# Patient Record
Sex: Female | Born: 2000 | Race: White | Hispanic: No | Smoking: Former smoker
Health system: Southern US, Community
[De-identification: ages and names within clinical notes are randomized; demographics above are authoritative.]

## PROBLEM LIST (undated history)

## (undated) DIAGNOSIS — E7800 Pure hypercholesterolemia, unspecified: Secondary | ICD-10-CM

## (undated) DIAGNOSIS — L409 Psoriasis, unspecified: Secondary | ICD-10-CM

## (undated) DIAGNOSIS — J45909 Unspecified asthma, uncomplicated: Secondary | ICD-10-CM

## (undated) DIAGNOSIS — T7411XA Adult physical abuse, confirmed, initial encounter: Secondary | ICD-10-CM

## (undated) DIAGNOSIS — F319 Bipolar disorder, unspecified: Secondary | ICD-10-CM

## (undated) DIAGNOSIS — F4310 Post-traumatic stress disorder, unspecified: Secondary | ICD-10-CM

## (undated) HISTORY — PX: HIP SURGERY: SHX245

## (undated) HISTORY — DX: Pure hypercholesterolemia, unspecified: E78.00

## (undated) HISTORY — DX: Unspecified asthma, uncomplicated: J45.909

---
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
## 2021-11-21 ENCOUNTER — Emergency Department (HOSPITAL_COMMUNITY): Payer: Medicaid Other

## 2021-11-21 ENCOUNTER — Encounter (HOSPITAL_COMMUNITY): Payer: Self-pay | Admitting: *Deleted

## 2021-11-21 ENCOUNTER — Emergency Department (HOSPITAL_COMMUNITY)
Admission: EM | Admit: 2021-11-21 | Discharge: 2021-11-21 | Disposition: A | Discharge status: Home / Self Care | Payer: Medicaid Other | Attending: Emergency Medicine | Admitting: Emergency Medicine

## 2021-11-21 ENCOUNTER — Other Ambulatory Visit: Payer: Self-pay

## 2021-11-21 DIAGNOSIS — F17210 Nicotine dependence, cigarettes, uncomplicated: Secondary | ICD-10-CM | POA: Insufficient documentation

## 2021-11-21 DIAGNOSIS — S3992XA Unspecified injury of lower back, initial encounter: Secondary | ICD-10-CM | POA: Diagnosis present

## 2021-11-21 DIAGNOSIS — X58XXXA Exposure to other specified factors, initial encounter: Secondary | ICD-10-CM | POA: Insufficient documentation

## 2021-11-21 DIAGNOSIS — S39012A Strain of muscle, fascia and tendon of lower back, initial encounter: Secondary | ICD-10-CM | POA: Diagnosis not present

## 2021-11-21 LAB — URINALYSIS, ROUTINE W REFLEX MICROSCOPIC
Bilirubin Urine: NEGATIVE
Glucose, UA: NEGATIVE mg/dL
Hgb urine dipstick: NEGATIVE
Ketones, ur: NEGATIVE mg/dL
Leukocytes,Ua: NEGATIVE
Nitrite: NEGATIVE
Protein, ur: NEGATIVE mg/dL
Specific Gravity, Urine: >1.03 — ABNORMAL HIGH (ref 1.005–1.030)
pH: 6 (ref 5.0–8.0)

## 2021-11-21 LAB — PREGNANCY, URINE: Preg Test, Ur: NEGATIVE

## 2021-11-21 MED ORDER — MELOXICAM 15 MG PO TABS
15.0000 mg | ORAL_TABLET | Freq: Once | ORAL | Status: AC
  Administered 2021-11-21: 15 mg via ORAL
  Filled 2021-11-21: qty 1

## 2021-11-21 MED ORDER — MELOXICAM 15 MG PO TABS: 15.0000 mg | ORAL_TABLET | Freq: Every day | ORAL | 0 refills | Status: DC

## 2021-11-21 MED ORDER — CYCLOBENZAPRINE HCL 10 MG PO TABS: 5.0000 mg | ORAL_TABLET | Freq: Two times a day (BID) | ORAL | 0 refills | Status: DC | PRN

## 2021-11-21 MED ORDER — CYCLOBENZAPRINE HCL 10 MG PO TABS
5.0000 mg | ORAL_TABLET | Freq: Once | ORAL | Status: AC
  Administered 2021-11-21: 5 mg via ORAL

## 2021-11-21 NOTE — ED Triage Notes (Signed)
Pt c/o lower back pain x 3 days; pt denies any urinary sx

## 2021-11-21 NOTE — Discharge Instructions (Signed)
Your back x-ray was normal, your urine is normal, urine pregnancy test was negative.  I am discharging you with pain medication and a muscle relaxer.  Also home stretches which you may use as needed. SEEK IMMEDIATE MEDICAL ATTENTION IF: New numbness, tingling, weakness, or problem with the use of your arms or legs.  Severe back pain not relieved with medications.  Change in bowel or bladder control.  Increasing pain in any areas of the body (such as chest or abdominal pain).  Shortness of breath, dizziness or fainting.  Nausea (feeling sick to your stomach), vomiting, fever, or sweats.
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
## 2021-12-05 ENCOUNTER — Other Ambulatory Visit: Payer: Self-pay

## 2021-12-05 ENCOUNTER — Ambulatory Visit
Admission: EM | Admit: 2021-12-05 | Discharge: 2021-12-05 | Disposition: A | Discharge status: Home / Self Care | Payer: Medicaid Other | Attending: Family Medicine | Admitting: Family Medicine

## 2021-12-05 DIAGNOSIS — H109 Unspecified conjunctivitis: Secondary | ICD-10-CM | POA: Diagnosis not present

## 2021-12-05 MED ORDER — POLYMYXIN B-TRIMETHOPRIM 10000-0.1 UNIT/ML-% OP SOLN: 1.0000 [drp] | Freq: Four times a day (QID) | OPHTHALMIC | 0 refills | Status: DC
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
## 2021-12-05 NOTE — ED Triage Notes (Signed)
Patient states she started noticing that her right eye was pink this morning but while sitting in waiting room her left eye has started turning red as well. She states it is itching really bad.  She states her brother was diagnosed over the weekend with the pink eye and she was with him all weekend.  She states she put Clear Eyes eyedrops in both of her eyes to see if that would help and it did not.  Denies Fever

## 2022-01-18 ENCOUNTER — Encounter (HOSPITAL_COMMUNITY): Payer: Self-pay

## 2022-01-18 ENCOUNTER — Emergency Department (HOSPITAL_COMMUNITY)
Admission: EM | Admit: 2022-01-18 | Discharge: 2022-01-18 | Disposition: A | Discharge status: Home / Self Care | Payer: Medicaid Other | Attending: Emergency Medicine | Admitting: Emergency Medicine

## 2022-01-18 ENCOUNTER — Other Ambulatory Visit: Payer: Self-pay

## 2022-01-18 DIAGNOSIS — M5412 Radiculopathy, cervical region: Secondary | ICD-10-CM | POA: Diagnosis not present

## 2022-01-18 DIAGNOSIS — M62838 Other muscle spasm: Secondary | ICD-10-CM

## 2022-01-18 DIAGNOSIS — M542 Cervicalgia: Secondary | ICD-10-CM | POA: Diagnosis present

## 2022-01-18 DIAGNOSIS — R21 Rash and other nonspecific skin eruption: Secondary | ICD-10-CM | POA: Insufficient documentation

## 2022-01-18 MED ORDER — METHOCARBAMOL 500 MG PO TABS: 500.0000 mg | ORAL_TABLET | Freq: Two times a day (BID) | ORAL | 0 refills | Status: DC

## 2022-01-18 MED ORDER — NAPROXEN 500 MG PO TABS: 500.0000 mg | ORAL_TABLET | Freq: Two times a day (BID) | ORAL | 0 refills | Status: DC

## 2022-01-18 MED ORDER — METHOCARBAMOL 500 MG PO TABS
500.0000 mg | ORAL_TABLET | Freq: Once | ORAL | Status: AC
  Administered 2022-01-18: 500 mg via ORAL
  Filled 2022-01-18: qty 1

## 2022-01-18 MED ORDER — NAPROXEN 250 MG PO TABS
500.0000 mg | ORAL_TABLET | Freq: Once | ORAL | Status: AC
  Administered 2022-01-18: 500 mg via ORAL
  Filled 2022-01-18: qty 2

## 2022-01-18 NOTE — ED Provider Notes (Signed)
[facility] EMERGENCY DEPARTMENT Provider Note  CSN: [id number] Arrival date & time: 01/18/22 1016  History Chief Complaint  Patient presents with   Neck Pain    Rebecca Marsh is a 21 y.o. female reports 2 month history of intermittent R neck pain radiating down her back and into her R arm. Went away for a few days and returned earlier this week. No falls or injuries. No weakness but does have some tingling in her arm. She has not been to see PCP for same. No leg pain, weakness, incontinence. No fevers.    Home Medications Prior to Admission medications   Medication Sig Start Date End Date Taking? Authorizing Provider  acetaminophen (TYLENOL) 325 MG tablet Take 650 mg by mouth every 6 (six) hours as needed.   Yes [provider]  methocarbamol (ROBAXIN) 500 MG tablet Take 1 tablet (500 mg total) by mouth 2 (two) times daily. 01/18/22  Yes Pollyann Savoy, MD  naproxen (NAPROSYN) 500 MG tablet Take 1 tablet (500 mg total) by mouth 2 (two) times daily. 01/18/22  Yes Pollyann Savoy, MD     Allergies    Penicillins and Almond (diagnostic)   Review of Systems   Review of Systems Please see HPI for pertinent positives and negatives  Physical Exam BP 110/72    Pulse 70    Temp 98 F (36.7 C) (Oral)    Resp 20    Ht 5\' 9"  (1.753 m)    Wt 118.8 kg    LMP 01/18/2022 (Exact Date)    SpO2 97%    BMI 38.69 kg/m   Physical Exam Vitals and nursing note reviewed.  HENT:     Head: Normocephalic.     Nose: Nose normal.  Eyes:     Extraocular Movements: Extraocular movements intact.  Pulmonary:     Effort: Pulmonary effort is normal.  Musculoskeletal:        General: Tenderness (R cervical and lumbar paraspinal muscles) present. Normal range of motion.     Cervical back: Neck supple.  Skin:    Findings: Rash (scalp, psoriasis) present.  Neurological:     General: No focal deficit present.     Mental Status: She is alert and oriented to person, place, and time.      Sensory: No sensory deficit.     Motor: No weakness.  Psychiatric:        Mood and Affect: Mood normal.    ED Results / Procedures / Treatments   EKG None  Procedures Procedures  Medications Ordered in the ED Medications  naproxen (NAPROSYN) tablet 500 mg (has no administration in time range)  methocarbamol (ROBAXIN) tablet 500 mg (has no administration in time range)    Initial Impression and Plan  Patient with likely cervical radiculopathy from muscle spasm in her neck and back. Will treat with NSAID, robaxin and PCP follow up.   ED Course       MDM Rules/Calculators/A&P Medical Decision Making Problems Addressed: Cervical radiculopathy: acute illness or injury with systemic symptoms Muscle spasms of neck: acute illness or injury with systemic symptoms  Risk Prescription drug management.    Final Clinical Impression(s) / ED Diagnoses Final diagnoses:  Muscle spasms of neck  Cervical radiculopathy    Rx / DC Orders ED Discharge Orders          Ordered    naproxen (NAPROSYN) 500 MG tablet  2 times daily        01/18/22 1128  methocarbamol (ROBAXIN) 500 MG tablet  2 times daily        01/18/22 1128             Pollyann Savoy, MD 01/18/22 1129

## 2022-01-18 NOTE — ED Triage Notes (Addendum)
Patient complaining of neck pain that moves down into back and also to right hand and makes them tingly. This has been going on for a couple months.

## 2022-04-09 ENCOUNTER — Telehealth: Payer: Self-pay | Admitting: Family Medicine

## 2022-04-09 ENCOUNTER — Encounter: Payer: Self-pay | Admitting: Family Medicine

## 2022-04-09 ENCOUNTER — Ambulatory Visit (INDEPENDENT_AMBULATORY_CARE_PROVIDER_SITE_OTHER): Payer: Medicaid Other | Admitting: Family Medicine

## 2022-04-09 VITALS — BP 117/73 | HR 60 | Temp 98.0°F | Ht 69.0 in | Wt 264.1 lb

## 2022-04-09 DIAGNOSIS — L409 Psoriasis, unspecified: Secondary | ICD-10-CM

## 2022-04-09 DIAGNOSIS — E669 Obesity, unspecified: Secondary | ICD-10-CM | POA: Diagnosis not present

## 2022-04-09 DIAGNOSIS — N946 Dysmenorrhea, unspecified: Secondary | ICD-10-CM

## 2022-04-09 DIAGNOSIS — J4530 Mild persistent asthma, uncomplicated: Secondary | ICD-10-CM | POA: Diagnosis not present

## 2022-04-09 DIAGNOSIS — Z319 Encounter for procreative management, unspecified: Secondary | ICD-10-CM

## 2022-04-09 DIAGNOSIS — F333 Major depressive disorder, recurrent, severe with psychotic symptoms: Secondary | ICD-10-CM

## 2022-04-09 DIAGNOSIS — F411 Generalized anxiety disorder: Secondary | ICD-10-CM

## 2022-04-09 MED ORDER — BUSPIRONE HCL 30 MG PO TABS: 30.0000 mg | ORAL_TABLET | Freq: Two times a day (BID) | ORAL | 1 refills | Status: DC

## 2022-04-09 MED ORDER — ALBUTEROL SULFATE HFA 108 (90 BASE) MCG/ACT IN AERS: 2.0000 | INHALATION_SPRAY | Freq: Four times a day (QID) | RESPIRATORY_TRACT | 2 refills | Status: DC | PRN

## 2022-04-09 MED ORDER — BUDESONIDE-FORMOTEROL FUMARATE 80-4.5 MCG/ACT IN AERO: 2.0000 | INHALATION_SPRAY | Freq: Two times a day (BID) | RESPIRATORY_TRACT | 12 refills | Status: DC

## 2022-04-09 NOTE — Patient Instructions (Signed)

## 2022-04-09 NOTE — Telephone Encounter (Signed)
Rebecca Marsh are you able to remove nexplanon?  ?
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
## 2022-04-10 NOTE — Telephone Encounter (Signed)
Left detailed message for patient to call and schedule  nexplanon removal with Kari Baars, FNP.  Marcelino Duster recommends 30 minute slot. ?

## 2022-04-10 NOTE — Telephone Encounter (Signed)
LM to RC  

## 2022-04-11 ENCOUNTER — Encounter: Payer: Medicaid Other | Admitting: Advanced Practice Midwife

## 2022-04-24 ENCOUNTER — Encounter: Payer: Medicaid Other | Admitting: Women's Health

## 2022-04-24 ENCOUNTER — Encounter: Payer: Self-pay | Admitting: Obstetrics & Gynecology

## 2022-04-24 ENCOUNTER — Ambulatory Visit (INDEPENDENT_AMBULATORY_CARE_PROVIDER_SITE_OTHER): Payer: Medicaid Other | Admitting: Adult Health

## 2022-04-24 VITALS — BP 136/88 | HR 68 | Ht 69.0 in | Wt 261.2 lb

## 2022-04-24 DIAGNOSIS — Z3046 Encounter for surveillance of implantable subdermal contraceptive: Secondary | ICD-10-CM | POA: Diagnosis not present

## 2022-04-24 NOTE — Progress Notes (Signed)
?  Subjective:  ?  ? Patient ID: Rebecca Marsh, female   DOB: [birth year], 21 y.o.   MRN: [id number] ? ?HPI ?Rebecca Marsh is a 21 year old white female,single, G0P0, in for nexplanon removal. She is a new pt. ?She says she had a pap in February at health dept. ? ? ?Review of Systems ?For nexplanon removal ?Reviewed past medical,surgical, social and family history. Reviewed medications and allergies.  ?   ?Objective:  ? Physical Exam ?BP 136/88 (BP Location: Right Arm, Patient Position: Sitting, Cuff Size: Normal)   Pulse 68   Ht 5\' 9"  (1.753 m)   Wt 261 lb 3.2 oz (118.5 kg)   LMP  (LMP Unknown) Comment: April  BMI 38.57 kg/m?   ?  Consent signed and time out called ?Left arm cleansed with betadine, and injected with 1.5 cc 2% lidocaine and waited til numb.Under sterile technique a #11 blade was used to make small vertical incision, and a curved forceps was used to easily remove rod. Steri strips applied. Pressure dressing applied.  ?AA is 3 ?Fall risk is low ? ?  04/24/2022  ? 10:57 AM 04/09/2022  ?  9:20 AM  ?Depression screen PHQ 2/9  ?Decreased Interest 0 2  ?Down, Depressed, Hopeless 1 1  ?PHQ - 2 Score 1 3  ?Altered sleeping 0 3  ?Tired, decreased energy 0 0  ?Change in appetite 0 0  ?Feeling bad or failure about yourself  0 0  ?Trouble concentrating 0 3  ?Moving slowly or fidgety/restless 0 0  ?Suicidal thoughts 0 0  ?PHQ-9 Score 1 9  ?Difficult doing work/chores  Not difficult at all  ?  ? ?  04/24/2022  ? 10:57 AM 04/09/2022  ?  9:22 AM  ?GAD 7 : Generalized Anxiety Score  ?Nervous, Anxious, on Edge 0 1  ?Control/stop worrying 0 0  ?Worry too much - different things 1 1  ?Trouble relaxing 0 2  ?Restless 1 2  ?Easily annoyed or irritable 1 1  ?Afraid - awful might happen 0 1  ?Total GAD 7 Score 3 8  ?Anxiety Difficulty  Not difficult at all  ? ? [place] - 04/24/22 1056   ? ?  ? Pregnancy Intention Screening  ? Does the patient want to become pregnant in the next year? Yes   ? Does the patient's partner want to  become pregnant in the next year? Yes   ? Would the patient like to discuss contraceptive options today? No   ?  ? Contraception Wrap Up  ? Current Method Hormonal Implant   ? End Method Pregnant/Seeking Pregnancy   ? Contraception Counseling Provided No   ? ?  ?  ? ?  ?  ?  ?Assessment:  ?   ?1. Encounter for Nexplanon removal ?Use condoms x 2 weeks, keep clean and dry x 24 hours, no heavy lifting, keep steri strips on x 72 hours, Keep pressure dressing on x 24 hours. Follow up prn problems.  ?  Take OTC PNV ?Plan:  ?   ?Follow up prn  ?   ?

## 2022-04-24 NOTE — Patient Instructions (Signed)
Use condoms x 2 weeks, keep clean and dry x 24 hours, no heavy lifting, keep steri strips on x 72 hours, Keep pressure dressing on x 24 hours. Follow up prn problems.  

## 2022-05-11 ENCOUNTER — Ambulatory Visit: Payer: Medicaid Other | Admitting: Nurse Practitioner

## 2022-05-23 ENCOUNTER — Ambulatory Visit (INDEPENDENT_AMBULATORY_CARE_PROVIDER_SITE_OTHER): Payer: Medicaid Other | Admitting: Family Medicine

## 2022-05-23 ENCOUNTER — Encounter: Payer: Self-pay | Admitting: Family Medicine

## 2022-05-23 VITALS — BP 91/60 | HR 69 | Temp 97.9°F | Ht 69.0 in | Wt 252.4 lb

## 2022-05-23 DIAGNOSIS — Z23 Encounter for immunization: Secondary | ICD-10-CM

## 2022-05-23 DIAGNOSIS — F909 Attention-deficit hyperactivity disorder, unspecified type: Secondary | ICD-10-CM | POA: Diagnosis not present

## 2022-05-23 DIAGNOSIS — F411 Generalized anxiety disorder: Secondary | ICD-10-CM

## 2022-05-23 DIAGNOSIS — E669 Obesity, unspecified: Secondary | ICD-10-CM

## 2022-05-23 DIAGNOSIS — J4530 Mild persistent asthma, uncomplicated: Secondary | ICD-10-CM

## 2022-05-23 DIAGNOSIS — F333 Major depressive disorder, recurrent, severe with psychotic symptoms: Secondary | ICD-10-CM | POA: Diagnosis not present

## 2022-05-23 LAB — CBC WITH DIFFERENTIAL/PLATELET
Immature Grans (Abs): 0 10*3/uL (ref 0.0–0.1)
Lymphocytes Absolute: 2.7 10*3/uL (ref 0.7–3.1)
Lymphs: 31 %
MCV: 79 fL (ref 79–97)
Monocytes: 6 %
Neutrophils: 58 %
Platelets: 317 10*3/uL (ref 150–450)

## 2022-05-23 LAB — CMP14+EGFR

## 2022-05-23 LAB — LIPID PANEL

## 2022-05-23 MED ORDER — BUSPIRONE HCL 30 MG PO TABS: 30.0000 mg | ORAL_TABLET | Freq: Two times a day (BID) | ORAL | 1 refills | Status: DC

## 2022-05-23 NOTE — Patient Instructions (Signed)

## 2022-05-23 NOTE — Progress Notes (Signed)
Established Patient Office Visit  Subjective   Patient ID: Rebecca Marsh, female    DOB: May 06, 2001  Age: 21 y.o. MRN: [id number]  Chief Complaint  Patient presents with   Depression    Depression       Weslynn has restarted on buspar 30 mg BID. She reports doing well without side effects. She reports that this has been controlling her symptoms well. She does also have a history of ADHD and has been struggling with her focus. She would like a referral to psychiatry.   She is fasting this morning. She has lost 9 lbs in the last few weeks. She isn't sure how. She does report that she hasn't been stress eating as much.      05/23/2022    8:03 AM 04/24/2022   10:57 AM 04/09/2022    9:20 AM  Depression screen PHQ 2/9  Decreased Interest 0 0 2  Down, Depressed, Hopeless 0 1 1  PHQ - 2 Score 0 1 3  Altered sleeping 0 0 3  Tired, decreased energy 0 0 0  Change in appetite 0 0 0  Feeling bad or failure about yourself  0 0 0  Trouble concentrating 0 0 3  Moving slowly or fidgety/restless 0 0 0  Suicidal thoughts 0 0 0  PHQ-9 Score 0 1 9  Difficult doing work/chores Not difficult at all  Not difficult at all      05/23/2022    8:03 AM 04/24/2022   10:57 AM 04/09/2022    9:22 AM  GAD 7 : Generalized Anxiety Score  Nervous, Anxious, on Edge 1 0 1  Control/stop worrying 0 0 0  Worry too much - different things 0 1 1  Trouble relaxing 0 0 2  Restless 0 1 2  Easily annoyed or irritable 0 1 1  Afraid - awful might happen 0 0 1  Total GAD 7 Score 1 3 8   Anxiety Difficulty Not difficult at all  Not difficult at all     Patient Active Problem List   Diagnosis Date Noted   Encounter for Nexplanon removal 04/24/2022   Severe recurrent major depression w/psychotic features, mood-congruent ([place]) 08/19/2018      Review of Systems  Psychiatric/Behavioral:  Positive for depression.   Negative unless specially indicated above in HPI.   Objective:     BP 91/60   Pulse 69   Temp  97.9 F (36.6 C) (Temporal)   Ht 5' 9"  (1.753 m)   Wt 252 lb 6 oz (114.5 kg)   LMP  (LMP Unknown) Comment: April  SpO2 98%   BMI 37.27 kg/m  BP Readings from Last 3 Encounters:  05/23/22 91/60  04/24/22 136/88  04/09/22 117/73      Physical Exam Vitals and nursing note reviewed.  Constitutional:      General: She is not in acute distress.    Appearance: She is not ill-appearing, toxic-appearing or diaphoretic.  HENT:     Nose: Nose normal.  Cardiovascular:     Rate and Rhythm: Normal rate and regular rhythm.     Heart sounds: Normal heart sounds. No murmur heard. Pulmonary:     Effort: Pulmonary effort is normal. No respiratory distress.     Breath sounds: Normal breath sounds.  Abdominal:     General: Bowel sounds are normal.     Palpations: Abdomen is soft.  Musculoskeletal:     Right lower leg: No edema.     Left lower leg: No  edema.  Skin:    General: Skin is warm and dry.  Neurological:     General: No focal deficit present.     Mental Status: She is alert and oriented to person, place, and time.  Psychiatric:        Mood and Affect: Mood normal.        Behavior: Behavior normal.        Thought Content: Thought content normal.        Judgment: Judgment normal.     No results found for any visits on 05/23/22.    The ASCVD Risk score (Arnett DK, et al., 2019) failed to calculate for the following reasons:   The 2019 ASCVD risk score is only valid for ages 29 to 32    Assessment & Plan:   Jori was seen today for depression.  Diagnoses and all orders for this visit:  Severe recurrent major depression w/psychotic features, mood-congruent ([place]) Generalized anxiety disorder Well controlled on current regimen. Referral placed today.  -     Ambulatory referral to Psychiatry -     busPIRone (BUSPAR) 30 MG tablet; Take 1 tablet (30 mg total) by mouth 2 (two) times daily.  Attention deficit hyperactivity disorder (ADHD), unspecified ADHD type Referral  placed.  -     Ambulatory referral to Psychiatry  Obesity (BMI 35.0-39.9 without comorbidity) BMI 37. Fasting labs pending. Diet and exercise.  -     CBC with Differential/Platelet -     CMP14+EGFR -     Lipid panel -     TSH  Mild persistent asthma Well controlled on current regimen.   TDap vaccine today in office.    Return in about 6 months (around 11/22/2022) for CPE.   The patient indicates understanding of these issues and agrees with the plan.  Gwenlyn Perking, FNP

## 2022-05-24 LAB — CBC WITH DIFFERENTIAL/PLATELET
Basophils Absolute: 0.1 10*3/uL (ref 0.0–0.2)
Basos: 1 %
EOS (ABSOLUTE): 0.3 10*3/uL (ref 0.0–0.4)
Eos: 4 %
Hematocrit: 40.1 % (ref 34.0–46.6)
Hemoglobin: 12.8 g/dL (ref 11.1–15.9)
Immature Granulocytes: 0 %
MCH: 25.2 pg — ABNORMAL LOW (ref 26.6–33.0)
MCHC: 31.9 g/dL (ref 31.5–35.7)
Monocytes Absolute: 0.6 10*3/uL (ref 0.1–0.9)
Neutrophils Absolute: 5.3 10*3/uL (ref 1.4–7.0)
RBC: 5.08 x10E6/uL (ref 3.77–5.28)
RDW: 12.8 % (ref 11.7–15.4)
WBC: 9 10*3/uL (ref 3.4–10.8)

## 2022-05-24 LAB — CMP14+EGFR
ALT: 18 IU/L (ref 0–32)
AST: 21 IU/L (ref 0–40)
Albumin/Globulin Ratio: 1.6 (ref 1.2–2.2)
Albumin: 4.4 g/dL (ref 3.9–5.0)
Alkaline Phosphatase: 87 IU/L (ref 44–121)
BUN/Creatinine Ratio: 12 (ref 9–23)
BUN: 10 mg/dL (ref 6–20)
Bilirubin Total: <0.2 mg/dL (ref 0.0–1.2)
CO2: 22 mmol/L (ref 20–29)
Calcium: 9.5 mg/dL (ref 8.7–10.2)
Chloride: 104 mmol/L (ref 96–106)
Creatinine, Ser: 0.81 mg/dL (ref 0.57–1.00)
Globulin, Total: 2.7 g/dL (ref 1.5–4.5)
Glucose: 96 mg/dL (ref 70–99)
Potassium: 4.5 mmol/L (ref 3.5–5.2)
Total Protein: 7.1 g/dL (ref 6.0–8.5)

## 2022-05-24 LAB — LIPID PANEL
Cholesterol, Total: 192 mg/dL (ref 100–199)
LDL Chol Calc (NIH): 138 mg/dL — ABNORMAL HIGH (ref 0–99)
Triglycerides: 141 mg/dL (ref 0–149)
VLDL Cholesterol Cal: 26 mg/dL (ref 5–40)

## 2022-05-24 LAB — TSH: TSH: 1.64 u[IU]/mL (ref 0.450–4.500)

## 2022-06-20 ENCOUNTER — Other Ambulatory Visit: Payer: Self-pay | Admitting: *Deleted

## 2022-06-20 DIAGNOSIS — J4530 Mild persistent asthma, uncomplicated: Secondary | ICD-10-CM

## 2022-06-20 MED ORDER — ALBUTEROL SULFATE HFA 108 (90 BASE) MCG/ACT IN AERS: 2.0000 | INHALATION_SPRAY | Freq: Four times a day (QID) | RESPIRATORY_TRACT | 2 refills | Status: DC | PRN

## 2022-07-02 ENCOUNTER — Ambulatory Visit
Admission: EM | Admit: 2022-07-02 | Discharge: 2022-07-02 | Disposition: A | Discharge status: Home / Self Care | Payer: Medicaid Other | Attending: Urgent Care | Admitting: Urgent Care

## 2022-07-02 DIAGNOSIS — R200 Anesthesia of skin: Secondary | ICD-10-CM

## 2022-07-02 DIAGNOSIS — L259 Unspecified contact dermatitis, unspecified cause: Secondary | ICD-10-CM

## 2022-07-02 DIAGNOSIS — R202 Paresthesia of skin: Secondary | ICD-10-CM | POA: Diagnosis not present

## 2022-07-02 MED ORDER — IBUPROFEN 600 MG PO TABS: 600.0000 mg | ORAL_TABLET | Freq: Four times a day (QID) | ORAL | 0 refills | Status: DC | PRN

## 2022-07-02 MED ORDER — HYDROXYZINE HCL 25 MG PO TABS: 12.5000 mg | ORAL_TABLET | Freq: Three times a day (TID) | ORAL | 0 refills | Status: DC | PRN

## 2022-07-02 MED ORDER — TRIAMCINOLONE ACETONIDE 0.1 % EX CREA: 1.0000 | TOPICAL_CREAM | Freq: Two times a day (BID) | CUTANEOUS | 0 refills | Status: DC
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
## 2022-07-05 ENCOUNTER — Ambulatory Visit: Payer: Medicaid Other | Admitting: Family Medicine

## 2022-07-09 ENCOUNTER — Ambulatory Visit (INDEPENDENT_AMBULATORY_CARE_PROVIDER_SITE_OTHER): Payer: Medicaid Other | Admitting: Family Medicine

## 2022-07-09 ENCOUNTER — Encounter: Payer: Self-pay | Admitting: Family Medicine

## 2022-07-09 VITALS — BP 142/97 | HR 101 | Temp 97.8°F | Resp 20 | Ht 69.0 in | Wt 244.5 lb

## 2022-07-09 DIAGNOSIS — F333 Major depressive disorder, recurrent, severe with psychotic symptoms: Secondary | ICD-10-CM

## 2022-07-09 DIAGNOSIS — L409 Psoriasis, unspecified: Secondary | ICD-10-CM

## 2022-07-09 DIAGNOSIS — J4530 Mild persistent asthma, uncomplicated: Secondary | ICD-10-CM | POA: Diagnosis not present

## 2022-07-09 DIAGNOSIS — F411 Generalized anxiety disorder: Secondary | ICD-10-CM | POA: Diagnosis not present

## 2022-07-09 MED ORDER — BUSPIRONE HCL 30 MG PO TABS: 30.0000 mg | ORAL_TABLET | Freq: Two times a day (BID) | ORAL | 0 refills | Status: DC

## 2022-07-09 MED ORDER — BUDESONIDE-FORMOTEROL FUMARATE 80-4.5 MCG/ACT IN AERO: 2.0000 | INHALATION_SPRAY | Freq: Two times a day (BID) | RESPIRATORY_TRACT | 2 refills | Status: DC

## 2022-07-09 MED ORDER — BUSPIRONE HCL 15 MG PO TABS: 15.0000 mg | ORAL_TABLET | Freq: Two times a day (BID) | ORAL | 1 refills | Status: DC
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
## 2022-07-09 NOTE — Patient Instructions (Signed)
Psoriasis ?Psoriasis is a long-term (chronic) skin condition. It occurs because your immune system causes skin cells to form too quickly. As a result, too many skin cells grow and create raised, red patches (plaques) that often look silvery on your skin. Plaques may show up anywhere on your body. They can be any size or shape. Symptoms of this condition range from mild to very severe. ?Psoriasis cannot be passed from one person to another (is not contagious). Sometimes, the symptoms go away and then come back again. ?What are the causes? ?The cause of psoriasis is not known, but certain factors can make the condition worse. These include: ?Damage or trauma to the skin, such as cuts, scrapes, sunburn, and dryness. ?Not enough exposure to sunlight. ?Certain medicines. ?Alcohol. ?Tobacco use. ?Stress. ?Infections caused by bacteria or viruses. ?What increases the risk? ?You are more likely to develop this condition if you: ?Have a family history of psoriasis. ?Are obese. ?Are 20-40 years old. ?Are taking certain medicines. ?What are the signs or symptoms? ?There are different types of psoriasis. You can have more than one type of psoriasis during your life. The types are: ?Plaque. This is the most common. ?Guttate. This is also called eruptive psoriasis. ?Inverse. ?Pustular. ?Erythrodermic. ?Sebopsoriasis. ?Psoriatic arthritis. ?Each type of psoriasis has different symptoms. ?Plaque psoriasis symptoms include red, raised plaques with a silvery-white coating (scale). These plaques may be itchy. Your nails may be pitted and crumbly or fall off. ?Guttate psoriasis symptoms include small red spots that often show up on your trunk, arms, and legs. These spots may develop after you have been sick, especially with strep throat. ?Inverse psoriasis symptoms include plaques in your underarm area, under your breasts, or on your genitals, groin, or buttocks. ?Pustular psoriasis symptoms include pus-filled bumps that are painful,  red, and swollen on the palms of your hands or the soles of your feet. You also may feel exhausted, feverish, weak, or have no appetite. ?Erythrodermic psoriasis symptoms include bright red skin that may look burned. You may have a fast heartbeat and a body temperature that is too high or too low. You may be itchy or in pain. ?Sebopsoriasis symptoms include red plaques that have a greasy coating, and are often on your scalp, forehead, and face. ?Psoriatic arthritis causes swollen, painful joints along with scaly skin plaques. ?How is this diagnosed? ?This condition is diagnosed based on your symptoms, family history, and a physical exam. ?You may also be referred to a health care provider who specializes in skin diseases (dermatologist). ?Your health care provider may remove a tissue sample (biopsy) for testing. ?How is this treated? ?There is no cure for this condition, but treatment can help manage it. Goals of treatment include: ?Helping your skin heal. ?Reducing itching and inflammation. ?Slowing the growth of new skin cells. ?Helping your immune system respond better to your skin. ?Treatment varies, depending on the severity of your condition. This condition may be treated by: ?Creams or ointments to help with symptoms. ?Ultraviolet ray exposure (light therapy or phototherapy). This may include natural sunlight or light therapy in a medical office. ?Medicines (systemic therapy). These medicines can help your body better manage skin cell turnover and inflammation. Medicines may be given in the form of pills or injections. They may be used along with light therapy or ointments. You may also get antibiotic medicines if you have an infection. ?Follow these instructions at home: ?Skin Care ?Moisturize your skin as needed. Only use moisturizers that have been approved by   your health care provider. ?Apply cool, wet cloths (cold compresses) to the affected areas. ?Do not use a hot tub or take hot showers. Take lukewarm  showers and baths. ?Do not scratch your skin. ?Lifestyle ? ?Do not use any products that contain nicotine or tobacco, such as cigarettes, e-cigarettes, and chewing tobacco. If you need help quitting, ask your health care provider. ?Use techniques for stress reduction, such as meditation or yoga. ?Maintain a healthy weight. Follow instructions from your health care provider for weight control. These may include dietary restrictions. ?Get safe exposure to the sun as told by your health care provider. This may include spending regular intervals of time outdoors in sunlight. Do not get sunburned. ?Consider joining a psoriasis support group. ?Medicines ?Take or use over-the-counter and prescription medicines only as told by your health care provider. ?If you were prescribed an antibiotic medicine, take it as told by your health care provider. Do not stop using the antibiotic even if you start to feel better. ?Alcohol use ?Limit how much you use: ?0-1 drink a day for women. ?0-2 drinks a day for men. ?Be aware of how much alcohol is in your drink. In the [place] one drink equals one 12 oz bottle of beer (355 mL), one 5 oz glass of wine (148 mL), or one 1? oz glass of hard liquor (44 mL). ?General instructions ?Keep a journal to help track what triggers an outbreak. Try to avoid any triggers. ?See a counselor if feelings of sadness, frustration, and hopelessness about your condition are interfering with your work and relationships. ?Keep all follow-up visits as told by your health care provider. This is important. ?Contact a health care provider if: ?You have a fever. ?Your pain gets worse. ?You have increasing redness or warmth in the affected areas. ?You have new or worsening pain or stiffness in your joints. ?Your nails start to break easily or pull away from the nail bed. ?You feel depressed. ?Summary ?Psoriasis is a long-term (chronic) skin condition. Patches (plaques) may show up anywhere on your body. ?There is no  cure for this condition, but treatment can help manage it. Treatment varies, depending on the severity of your condition. ?Keep a journal to track what triggers an outbreak. Try to avoid any triggers. ?Take or use over-the-counter and prescription medicines only as told by your health care provider. ?Keep all follow-up visits as told by your health care provider. This is important. ?This information is not intended to replace advice given to you by your health care provider. Make sure you discuss any questions you have with your health care provider. ?Document Revised: 09/21/2021 Document Reviewed: 09/22/2021 ?Elsevier Patient Education ? 2023 [employer] Inc[employer] ? ?

## 2022-09-18 ENCOUNTER — Encounter: Payer: Self-pay | Admitting: *Deleted

## 2022-11-23 ENCOUNTER — Encounter: Payer: Medicaid Other | Admitting: Family Medicine

## 2022-11-29 ENCOUNTER — Encounter: Payer: Medicaid Other | Admitting: Family Medicine

## 2022-12-24 NOTE — L&D Delivery Note (Addendum)
Delivery Note Rebecca Marsh is a 22 y.o. G4P0030 at [redacted]w[redacted]d admitted for induction of labor d/t gHTN.   GBS Status: positive (urine) Maximum Maternal Temperature: 36.7 C  Labor course: Initial SVE: 1/40/-2. Augmentation with: AROM and Pitocin. She then rapidly progressed to complete. FCS, IUPC for recurrent late decels. Later improved. ROM: 4h 97m with clear, copious fluid  Birth: At 1334 a viable female was delivered via spontaneous vaginal delivery (Presentation:occipital; posterior). Nuchal cord present Yes; Loose nuchal cord reduced at perineum:  Shoulders and body delivered in usual fashion. Infant placed directly on mom's abdomen for bonding/skin-to-skin, baby dried and stimulated. Cord clamped x 2 after 1 minute and cut by grandmother.  Cord blood collected.  The placenta separated spontaneously and delivered via gentle cord traction.  Pitocin infused rapidly IV per protocol.  Fundus firm with massage.  Placenta inspected and appears to be intact with a 3 VC.  Placenta/Cord with the following complications: none .   Sponge and instrument count were correct x2.  Intrapartum complications:  None Anesthesia:  epidural Episiotomy: none Lacerations:  none Suture Repair:  n/a EBL (mL): 120   Infant: APGAR (1 MIN): 9  APGAR (5 MINS): 9  APGAR (10 MINS):    Infant weight: 2510g  Mom to postpartum.  Baby to Couplet care / Skin to Skin. Placenta to Pathology for reduced fetal size    Plans to Bottlefeed Contraception: Nexplanon Circumcision: N/A  Note sent to [facility]: FT for pp visit.  Windy Fast [place], Student-PA 10/03/2023 2:24 PM  GME ATTESTATION:  Evaluation and management procedures were performed by the Physician's Assistance student under my supervision. I was immediately available and gloved for direct supervision, assistance and direction throughout this encounter.  I also confirm that I have verified the information documented in the resident's note, and that I have also  personally reperformed the pertinent components of the physical exam and all of the medical decision making activities.  I have also made any necessary editorial changes.  [name], MD OB Fellow, Faculty Practice [facility], [facility] for [facility] 10/03/2023 5:06 PM

## 2023-01-06 ENCOUNTER — Emergency Department (HOSPITAL_COMMUNITY)
Admission: EM | Admit: 2023-01-06 | Discharge: 2023-01-06 | Disposition: A | Discharge status: Home / Self Care | Payer: Medicaid Other | Attending: Emergency Medicine | Admitting: Emergency Medicine

## 2023-01-06 ENCOUNTER — Other Ambulatory Visit: Payer: Self-pay

## 2023-01-06 ENCOUNTER — Encounter (HOSPITAL_COMMUNITY): Payer: Self-pay

## 2023-01-06 ENCOUNTER — Emergency Department (HOSPITAL_COMMUNITY): Payer: Medicaid Other

## 2023-01-06 DIAGNOSIS — R1013 Epigastric pain: Secondary | ICD-10-CM | POA: Insufficient documentation

## 2023-01-06 DIAGNOSIS — R1011 Right upper quadrant pain: Secondary | ICD-10-CM | POA: Diagnosis present

## 2023-01-06 DIAGNOSIS — J45909 Unspecified asthma, uncomplicated: Secondary | ICD-10-CM | POA: Diagnosis not present

## 2023-01-06 DIAGNOSIS — R1031 Right lower quadrant pain: Secondary | ICD-10-CM | POA: Diagnosis not present

## 2023-01-06 LAB — COMPREHENSIVE METABOLIC PANEL
ALT: 19 U/L (ref 0–44)
AST: 26 U/L (ref 15–41)
Albumin: 4.5 g/dL (ref 3.5–5.0)
Alkaline Phosphatase: 77 U/L (ref 38–126)
Anion gap: 10 (ref 5–15)
BUN: 19 mg/dL (ref 6–20)
CO2: 23 mmol/L (ref 22–32)
Calcium: 9.6 mg/dL (ref 8.9–10.3)
Chloride: 104 mmol/L (ref 98–111)
Creatinine, Ser: 0.89 mg/dL (ref 0.44–1.00)
GFR, Estimated: >60 mL/min (ref >60)
Glucose, Bld: 83 mg/dL (ref 70–99)
Potassium: 4.3 mmol/L (ref 3.5–5.1)
Sodium: 137 mmol/L (ref 135–145)
Total Bilirubin: 0.6 mg/dL (ref 0.3–1.2)
Total Protein: 8.1 g/dL (ref 6.5–8.1)

## 2023-01-06 LAB — POC URINE PREG, ED: Preg Test, Ur: NEGATIVE

## 2023-01-06 LAB — URINALYSIS, ROUTINE W REFLEX MICROSCOPIC
Bilirubin Urine: NEGATIVE
Glucose, UA: NEGATIVE mg/dL
Hgb urine dipstick: NEGATIVE
Ketones, ur: 20 mg/dL — AB
Leukocytes,Ua: NEGATIVE
Nitrite: NEGATIVE
Protein, ur: 30 mg/dL — AB
Specific Gravity, Urine: 1.031 — ABNORMAL HIGH (ref 1.005–1.030)
pH: 6 (ref 5.0–8.0)

## 2023-01-06 LAB — CBC
HCT: 41.6 % (ref 36.0–46.0)
Hemoglobin: 13.3 g/dL (ref 12.0–15.0)
MCH: 26 pg (ref 26.0–34.0)
MCHC: 32 g/dL (ref 30.0–36.0)
MCV: 81.4 fL (ref 80.0–100.0)
Platelets: 340 10*3/uL (ref 150–400)
RBC: 5.11 MIL/uL (ref 3.87–5.11)
RDW: 13.2 % (ref 11.5–15.5)
WBC: 13.4 10*3/uL — ABNORMAL HIGH (ref 4.0–10.5)
nRBC: 0 % (ref 0.0–0.2)

## 2023-01-06 LAB — LIPASE, BLOOD: Lipase: 52 U/L — ABNORMAL HIGH (ref 11–51)

## 2023-01-06 MED ORDER — DICYCLOMINE HCL 10 MG PO CAPS
10.0000 mg | ORAL_CAPSULE | Freq: Once | ORAL | Status: AC
  Administered 2023-01-06: 10 mg via ORAL
  Filled 2023-01-06: qty 1

## 2023-01-06 MED ORDER — SODIUM CHLORIDE 0.9 % IV BOLUS
500.0000 mL | Freq: Once | INTRAVENOUS | Status: AC
  Administered 2023-01-06: 500 mL via INTRAVENOUS

## 2023-01-06 MED ORDER — IOHEXOL 300 MG/ML  SOLN
100.0000 mL | Freq: Once | INTRAMUSCULAR | Status: AC | PRN
  Administered 2023-01-06: 100 mL via INTRAVENOUS

## 2023-01-06 NOTE — Discharge Instructions (Addendum)
You have been seen today for your complaint of abdominal pain. Your lab work was overall reassuring. Your imaging was reassuring and showed no acute abdominal abnormalities. Your discharge medications include Alternate tylenol and ibuprofen for pain. You may alternate these every 4 hours. You may take up to 800 mg of ibuprofen at a time and up to 1000 mg of tylenol. Home care instructions are as follows:  Avoid fatty or greasy foods Follow up with: For ultrasound tomorrow.  You should call [phone] tomorrow morning to schedule your ultrasound You should also call the office of Dr. Arnoldo Morale to schedule a follow up visit. His number is listed in this packet. He is a Education officer, environmental. Please seek immediate medical care if you develop any of the following symptoms: Your pain does not go away as soon as your health care provider told you to expect. You cannot stop vomiting. Your pain is only in areas of the abdomen, such as the right side or the left lower portion of the abdomen. Pain on the right side could be caused by appendicitis. You have bloody or black stools, or stools that look like tar. You have severe pain, cramping, or bloating in your abdomen. You have signs of dehydration, such as: Dark urine, very little urine, or no urine. Cracked lips. Dry mouth. Sunken eyes. Sleepiness. Weakness. You have trouble breathing or chest pain. At this time there does not appear to be the presence of an emergent medical condition, however there is always the potential for conditions to change. Please read and follow the below instructions.  Do not take your medicine if  develop an itchy rash, swelling in your mouth or lips, or difficulty breathing; call 911 and seek immediate emergency medical attention if this occurs.  You may review your lab tests and imaging results in their entirety on your MyChart account.  Please discuss all results of fully with your primary care provider and other specialist  at your follow-up visit.  Note: Portions of this text may have been transcribed using voice recognition software. Every effort was made to ensure accuracy; however, inadvertent computerized transcription errors may still be present.

## 2023-01-06 NOTE — ED Triage Notes (Signed)
Pt presents to ED with complaints of right sided abdominal pain since Christmas, states comes and goes, N/V, denies diarrhea

## 2023-01-06 NOTE — ED Provider Triage Note (Signed)
Emergency Medicine Provider Triage Evaluation Note  Rebecca Marsh , a 22 y.o. female  was evaluated in triage.  Pt complains of abdominal pain.  States it started at Christmas time.  Endorses associated nausea vomiting but no diarrhea no bloody stools.  Pain is located in the right upper quadrant and extends to her back at times.  It is worse after meals.  Denies fever.  Review of Systems  Positive: See above Negative: See above  Physical Exam  BP 134/82 (BP Location: Right Arm)   Pulse 82   Temp 99.1 F (37.3 C) (Oral)   Resp 20   Ht 5\' 9"  (1.753 m)   Wt 108.4 kg   LMP  (Within Months)   SpO2 98%   BMI 35.29 kg/m  Gen:   Awake, no distress   Resp:  Normal effort  MSK:   Moves extremities without difficulty  Other:  Ruq tendnerness  Medical Decision Making  Medically screening exam initiated at 5:56 PM.  Appropriate orders placed.  Rebecca Marsh was informed that the remainder of the evaluation will be completed by another provider, this initial triage assessment does not replace that evaluation, and the importance of remaining in the ED until their evaluation is complete.  Work up started   Harriet Pho, PA-C 01/06/23 1757

## 2023-01-06 NOTE — ED Provider Notes (Signed)
[facility] EMERGENCY DEPARTMENT Provider Note   CSN: [id number] Arrival date & time: 01/06/23  1613     History  Chief Complaint  Patient presents with   Abdominal Pain    Rebecca Marsh is a 22 y.o. female.  With history of psoriasis, asthma, bipolar, PTSD who presents to the ED for evaluation of right-sided abdominal pain.  Pain started around the time of Christmas.  Has been intermittent since then but got significantly worse over the last 2 days.  Describes the pain as an intense ache.  States she does have nausea with this with few episodes of vomiting.  Denies diarrhea, melena and hematochezia.  States it is worse after eating.  Denies dysuria, frequency, urgency, chest pain, shortness of breath, fevers.  States she has a strong family history of gallbladder issues and both of her parents have had to have theirs removed.  States she had an appendectomy a few years ago.   Abdominal Pain      Home Medications Prior to Admission medications   Medication Sig Start Date End Date Taking? Authorizing Provider  albuterol (VENTOLIN HFA) 108 (90 Base) MCG/ACT inhaler Inhale 2 puffs into the lungs every 6 (six) hours as needed for wheezing or shortness of breath. 06/20/22   Gwenlyn Perking, FNP  budesonide-formoterol (SYMBICORT) 80-4.5 MCG/ACT inhaler Inhale 2 puffs into the lungs in the morning and at bedtime. 07/09/22   Gwenlyn Perking, FNP  busPIRone (BUSPAR) 15 MG tablet Take 1 tablet (15 mg total) by mouth 2 (two) times daily. 07/09/22   Gwenlyn Perking, FNP  hydrOXYzine (ATARAX) 25 MG tablet Take 0.5-1 tablets (12.5-25 mg total) by mouth every 8 (eight) hours as needed. Patient not taking: Reported on 07/09/2022 07/02/22   Jaynee Eagles, PA-C  ibuprofen (ADVIL) 600 MG tablet Take 1 tablet (600 mg total) by mouth every 6 (six) hours as needed. Patient not taking: Reported on 07/09/2022 07/02/22   Jaynee Eagles, PA-C  triamcinolone cream (KENALOG) 0.1 % Apply 1 Application topically 2  (two) times daily. Patient not taking: Reported on 07/09/2022 07/02/22   Jaynee Eagles, PA-C      Allergies    Penicillins and Almond (diagnostic)    Review of Systems   Review of Systems  Gastrointestinal:  Positive for abdominal pain.  All other systems reviewed and are negative.   Physical Exam Updated Vital Signs BP 112/72   Pulse 74   Temp 99 F (37.2 C) (Oral)   Resp 17   Ht 5\' 9"  (1.753 m)   Wt 108.4 kg   LMP  (LMP Unknown) Comment: neg preg test 01/06/23  SpO2 100%   BMI 35.29 kg/m  Physical Exam Vitals and nursing note reviewed.  Constitutional:      General: She is not in acute distress.    Appearance: She is well-developed. She is obese. She is not ill-appearing, toxic-appearing or diaphoretic.     Comments: Resting comfortably in bed  HENT:     Head: Normocephalic and atraumatic.  Eyes:     Conjunctiva/sclera: Conjunctivae normal.  Cardiovascular:     Rate and Rhythm: Normal rate and regular rhythm.     Heart sounds: No murmur heard. Pulmonary:     Effort: Pulmonary effort is normal. No respiratory distress.     Breath sounds: Normal breath sounds. No stridor. No wheezing, rhonchi or rales.  Abdominal:     Palpations: Abdomen is soft.     Tenderness: There is abdominal tenderness in the right  upper quadrant, right lower quadrant and epigastric area. There is right CVA tenderness. There is no left CVA tenderness, guarding or rebound. Negative signs include Murphy's sign and McBurney's sign.  Musculoskeletal:        General: No swelling.     Cervical back: Neck supple.  Skin:    General: Skin is warm and dry.     Capillary Refill: Capillary refill takes less than 2 seconds.  Neurological:     General: No focal deficit present.     Mental Status: She is alert and oriented to person, place, and time.  Psychiatric:        Mood and Affect: Mood normal.     ED Results / Procedures / Treatments   Labs (all labs ordered are listed, but only abnormal results  are displayed) Labs Reviewed  LIPASE, BLOOD - Abnormal; Notable for the following components:      Result Value   Lipase 52 (*)    All other components within normal limits  CBC - Abnormal; Notable for the following components:   WBC 13.4 (*)    All other components within normal limits  URINALYSIS, ROUTINE W REFLEX MICROSCOPIC - Abnormal; Notable for the following components:   APPearance HAZY (*)    Specific Gravity, Urine 1.031 (*)    Ketones, ur 20 (*)    Protein, ur 30 (*)    Bacteria, UA RARE (*)    All other components within normal limits  COMPREHENSIVE METABOLIC PANEL  POC URINE PREG, ED    EKG None  Radiology CT Abdomen Pelvis W Contrast  Result Date: 01/06/2023 CLINICAL DATA:  Patient complains of right-sided abdominal pain since Christmas that comes and goes. EXAM: CT ABDOMEN AND PELVIS WITH CONTRAST TECHNIQUE: Multidetector CT imaging of the abdomen and pelvis was performed using the standard protocol following bolus administration of intravenous contrast. RADIATION DOSE REDUCTION: This exam was performed according to the departmental dose-optimization program which includes automated exposure control, adjustment of the mA and/or kV according to patient size and/or use of iterative reconstruction technique. CONTRAST:  OMNIPAQUE IOHEXOL 300 MG/ML  SOLN COMPARISON:  None Available. FINDINGS: Lower chest: No acute abnormality. Hepatobiliary: No focal liver abnormality is seen. No gallstones, gallbladder wall thickening, or biliary dilatation. Pancreas: Unremarkable. No pancreatic ductal dilatation or surrounding inflammatory changes. Spleen: Normal in size without focal abnormality. Adrenals/Urinary Tract: Adrenal glands are unremarkable. Kidneys are normal, without renal calculi, focal lesion, or hydronephrosis. Bladder is unremarkable. Stomach/Bowel: Stomach is within normal limits. Appendix not definitely visualized. No evidence of bowel wall thickening, distention, or  inflammatory changes. Scattered colonic the diverticula without evidence of diverticulitis. Vascular/Lymphatic: No significant vascular findings are present. There are a few mildly prominent lymph nodes in the right lower quadrant near the terminal ileum/cecum for example a lymph node measuring 0.7 cm in short axis (series 2, image 48). There are prominent left inguinal lymph nodes for example a lymph node measuring 1.0 cm in short axis (series 2, image 104). Reproductive: Uterus and bilateral adnexa are unremarkable. Other: No abdominal wall hernia or abnormality. No abdominopelvic ascites. Musculoskeletal: No acute or significant osseous findings. IMPRESSION: 1. No acute intra-abdominal or pelvic pathology. 2. There are a few mildly prominent lymph nodes in the right lower quadrant near the terminal ileum/cecum which may represent mesenteric adenitis. The appendix is not definitely visualized but there are no inflammatory changes in the right lower quadrant. 3. Prominent left inguinal lymph nodes, nonspecific. Electronically Signed   By: Harriett Sine  Dimas Aguas M.D.   On: 01/06/2023 18:42    Procedures Procedures    Medications Ordered in ED Medications  iohexol (OMNIPAQUE) 300 MG/ML solution 100 mL (100 mLs Intravenous Contrast Given 01/06/23 1815)  dicyclomine (BENTYL) capsule 10 mg (10 mg Oral Given 01/06/23 2001)  sodium chloride 0.9 % bolus 500 mL (500 mLs Intravenous New Bag/Given 01/06/23 2001)    ED Course/ Medical Decision Making/ A&P                             Medical Decision Making Amount and/or Complexity of Data Reviewed Labs: ordered.  Risk Prescription drug management.  This patient presents to the ED for concern of abdominal pain, this involves an extensive number of treatment options, and is a complaint that carries with it a high risk of complications and morbidity. The differential diagnosis for generalized abdominal pain includes, but is not limited to AAA, gastroenteritis,  appendicitis, Bowel obstruction, Bowel perforation. Gastroparesis, DKA, Hernia, Inflammatory bowel disease, mesenteric ischemia, pancreatitis, peritonitis SBP, volvulus.   Co morbidities that complicate the patient evaluation  psoriasis, asthma, bipolar, PTSD  My initial workup includes abdominal pain labs, CT abdomen pelvis  Additional history obtained from: Nursing notes from this visit.  I ordered, reviewed and interpreted labs which include: CMP, CBC, lipase, urine pregnancy, urinalysis.  Lipase minimally elevated at 52.  Slight leukocytosis of 13.4.  Urinalysis concentrated at 1.031 with rare bacteria  I ordered imaging studies including CT abdomen pelvis I independently visualized and interpreted imaging which showed lymph node enlargement, no acute abdominal emergencies I agree with the radiologist interpretation  Afebrile, hemodynamically stable.  22 year old female presents ED for evaluation of right upper quadrant abdominal pain.  Symptoms have been intermittent and have been present since around Christmas time.  Physical exam is remarkable for tenderness to palpation in the right upper and lower quadrants.  Patient is post appendectomy.  Lab workup significant for leukocytosis of 13.4.  In the absence of fever, low suspicion for infectious etiology. History suspicious for biliary colic.  Patient has a strong family history of biliary issues.  CT abdomen pelvis shows no cholecystitis, cholelithiasis or common bile duct dilation.  Low suspicion for cholangitis.  Patient's pain improved significantly after p.o. Bentyl and IV fluids.  Patient will return tomorrow for right upper quadrant ultrasound.  This was ordered tonight.  She will call tomorrow to schedule this.  She will be advised to check back into the emergency department with critical results, otherwise she will call general surgeon Dr. Arnoldo Morale to schedule an ED follow-up visit.  Patient was given return precautions.  Stable at  discharge.  At this time there does not appear to be any evidence of an acute emergency medical condition and the patient appears stable for discharge with appropriate outpatient follow up. Diagnosis was discussed with patient who verbalizes understanding of care plan and is agreeable to discharge. I have discussed return precautions with patient who verbalizes understanding. Patient encouraged to follow-up with their PCP within 1 week. All questions answered.  Patient's case discussed with Dr. Sabra Heck who agrees with plan to discharge with follow-up.   Note: Portions of this report may have been transcribed using voice recognition software. Every effort was made to ensure accuracy; however, inadvertent computerized transcription errors may still be present.         Final Clinical Impression(s) / ED Diagnoses Final diagnoses:  Right upper quadrant abdominal pain    Rx /  DC Orders ED Discharge Orders          Ordered    US Abdomen Limited RUQ/Gall Gladder        01/06/23 2109              Mora Bellman 01/06/23 2113    Eber Hong, MD 01/06/23 2356

## 2023-01-07 ENCOUNTER — Telehealth: Payer: Self-pay

## 2023-01-07 NOTE — Telephone Encounter (Signed)
Transition Care Management Follow-up Telephone Call Date of discharge and from where: 01/06/2023 [facility] How have you been since you were released from the hospital? Still having abdominal pain.  I scheduled for ultrasound but appointment is not until February. Any questions or concerns? No  Items Reviewed: Did the pt receive and understand the discharge instructions provided? Yes  Medications obtained and verified?  N/A Other?  N/A Any new allergies since your discharge? No  Dietary orders reviewed? Yes Do you have support at home? Yes   Home Care and Equipment/Supplies: Were home health services ordered? not applicable If so, what is the name of the agency? N/A  Has the agency set up a time to come to the patient's home? not applicable Were any new equipment or medical supplies ordered?  No What is the name of the medical supply agency? N/A Were you able to get the supplies/equipment? not applicable Do you have any questions related to the use of the equipment or supplies? No  Functional Questionnaire: (I = Independent and D = Dependent) ADLs: I  Bathing/Dressing- I  Meal Prep- I  Eating- I  Maintaining continence- I  Transferring/Ambulation- I  Managing Meds- I  Follow up appointments reviewed:  PCP Hospital f/u appt confirmed? Yes  Scheduled to see Marjorie Smolder on 01/11/23 @ 3:00 pm. [facility] f/u appt confirmed? No   Are transportation arrangements needed? No  If their condition worsens, is the pt aware to call PCP or go to the Emergency Dept.? Yes Was the patient provided with contact information for the PCP's office or ED? Yes Was to pt encouraged to call back with questions or concerns? Yes

## 2023-01-11 ENCOUNTER — Ambulatory Visit: Payer: Medicaid Other | Admitting: Family Medicine

## 2023-01-15 ENCOUNTER — Ambulatory Visit (HOSPITAL_COMMUNITY)
Admission: RE | Admit: 2023-01-15 | Discharge: 2023-01-15 | Disposition: A | Discharge status: Home / Self Care | Payer: Medicaid Other | Source: Ambulatory Visit | Attending: Emergency Medicine | Admitting: Emergency Medicine

## 2023-01-15 DIAGNOSIS — R1011 Right upper quadrant pain: Secondary | ICD-10-CM | POA: Insufficient documentation

## 2023-01-18 ENCOUNTER — Ambulatory Visit: Payer: Medicaid Other | Admitting: Family Medicine

## 2023-01-18 ENCOUNTER — Encounter: Payer: Self-pay | Admitting: Family Medicine

## 2023-02-05 ENCOUNTER — Encounter: Payer: Self-pay | Admitting: Adult Health

## 2023-02-05 ENCOUNTER — Ambulatory Visit (INDEPENDENT_AMBULATORY_CARE_PROVIDER_SITE_OTHER): Payer: Medicaid Other | Admitting: Adult Health

## 2023-02-05 VITALS — BP 139/62 | HR 74 | Ht 69.0 in | Wt 218.5 lb

## 2023-02-05 DIAGNOSIS — Z3201 Encounter for pregnancy test, result positive: Secondary | ICD-10-CM | POA: Insufficient documentation

## 2023-02-05 DIAGNOSIS — R112 Nausea with vomiting, unspecified: Secondary | ICD-10-CM | POA: Insufficient documentation

## 2023-02-05 DIAGNOSIS — O219 Vomiting of pregnancy, unspecified: Secondary | ICD-10-CM | POA: Diagnosis not present

## 2023-02-05 DIAGNOSIS — Z3A01 Less than 8 weeks gestation of pregnancy: Secondary | ICD-10-CM | POA: Diagnosis not present

## 2023-02-05 DIAGNOSIS — O3680X0 Pregnancy with inconclusive fetal viability, not applicable or unspecified: Secondary | ICD-10-CM | POA: Diagnosis not present

## 2023-02-05 LAB — POCT URINE PREGNANCY: Preg Test, Ur: POSITIVE — AB

## 2023-02-05 MED ORDER — PRENATAL PLUS 27-1 MG PO TABS: 1.0000 | ORAL_TABLET | Freq: Every day | ORAL | 12 refills | Status: DC

## 2023-02-05 MED ORDER — PROMETHAZINE HCL 12.5 MG PO TABS: 12.5000 mg | ORAL_TABLET | Freq: Four times a day (QID) | ORAL | 1 refills | Status: DC | PRN

## 2023-02-05 NOTE — Progress Notes (Signed)
  Subjective:     Patient ID: Rebecca Marsh, female   DOB: [birth year], 22 y.o.   MRN: [id number]  HPI Kimanh is a 22 year old white female, with DP, in to discuss birth control,missed period and +UPT in office, was negative in ER 01/06/23.  PCP is Marjorie Smolder NP  Review of Systems Missed period  +nausea and vomiting    Reviewed past medical,surgical, social and family history. Reviewed medications and allergies.  Objective:   Physical Exam BP 139/62 (BP Location: Left Arm, Patient Position: Sitting, Cuff Size: Normal)   Pulse 74   Ht 5\' 9"  (1.753 m)   Wt 218 lb 8 oz (99.1 kg)   LMP 01/03/2023 (Approximate)   BMI 32.27 kg/m  UPT is positive.  About 4+5 weeks by LMP with EDD 10/10/23. Skin warm and dry. Neck: mid line trachea, normal thyroid, good ROM, no lymphadenopathy noted. Lungs: clear to ausculation bilaterally. Cardiovascular: regular rate and rhythm.    Abdomen is soft, tender to right.  Fall risk is low  Upstream - 02/05/23 1530       Pregnancy Intention Screening   Does the patient want to become pregnant in the next year? N/A    Does the patient's partner want to become pregnant in the next year? N/A    Would the patient like to discuss contraceptive options today? N/A      Contraception Wrap Up   Current Method Pregnant/Seeking Pregnancy    Reason for No Current Contraceptive Method at Intake ([facility] Only) Pregnant             Assessment:     1. Pregnancy examination or test, positive result  2. Less than [redacted] weeks gestation of pregnancy Will rx PNV  3. Encounter to determine fetal viability of pregnancy, single or unspecified fetus Return in about 4 weeks for dating [place] and new OB visit  4. Nausea and vomiting during pregnancy prior to [redacted] weeks gestation Will rx phenergan   Meds ordered this encounter  Medications   prenatal vitamin w/FE, FA (PRENATAL 1 + 1) 27-1 MG TABS tablet    Sig: Take 1 tablet by mouth daily at 12 noon.    Dispense:  30  tablet    Refill:  12    Order Specific Question:   Supervising Provider    Answer:   Tania Ade H [[id number]]   promethazine (PHENERGAN) 12.5 MG tablet    Sig: Take 1 tablet (12.5 mg total) by mouth every 6 (six) hours as needed.    Dispense:  30 tablet    Refill:  1    Order Specific Question:   Supervising Provider    Answer:   Florian Buff [[id number]]       Plan:    Dating [place] and new OB visit in about 4 weeks  Review OB packet

## 2023-02-14 ENCOUNTER — Encounter: Payer: Self-pay | Admitting: Family Medicine

## 2023-02-14 ENCOUNTER — Encounter: Payer: Medicaid Other | Admitting: Family Medicine

## 2023-02-22 ENCOUNTER — Telehealth: Payer: Self-pay

## 2023-02-22 NOTE — Transitions of Care (Post Inpatient/ED Visit) (Signed)
   02/22/2023  Name: Rebecca Marsh MRN: [id number] DOB: September 26, 2001  Today's TOC FU Call Status: Today's TOC FU Call Status:: Unsuccessul Call (1st Attempt) Unsuccessful Call (1st Attempt) Date: 02/22/23  Attempted to reach the patient regarding the most recent Inpatient/ED visit.  Follow Up Plan: Additional outreach attempts will be made to reach the patient to complete the Transitions of Care (Post Inpatient/ED visit) call.   Signature Juanda Crumble, [place] Direct Dial [phone]

## 2023-02-26 NOTE — Transitions of Care (Post Inpatient/ED Visit) (Signed)
   02/26/2023  Name: Rebecca Marsh MRN: [id number] DOB: September 28, 2001  Today's TOC FU Call Status: Today's TOC FU Call Status:: Unsuccessful Call (2nd Attempt) Unsuccessful Call (1st Attempt) Date: 02/22/23 Unsuccessful Call (2nd Attempt) Date: 02/26/23  Attempted to reach the patient regarding the most recent Inpatient/ED visit.  Follow Up Plan: No further outreach attempts will be made at this time. We have been unable to contact the patient.  Signature Juanda Crumble, [place] Direct Dial [phone]

## 2023-03-05 ENCOUNTER — Encounter: Payer: Medicaid Other | Admitting: Women's Health

## 2023-03-05 ENCOUNTER — Ambulatory Visit (INDEPENDENT_AMBULATORY_CARE_PROVIDER_SITE_OTHER): Payer: Medicaid Other

## 2023-03-05 DIAGNOSIS — O3680X0 Pregnancy with inconclusive fetal viability, not applicable or unspecified: Secondary | ICD-10-CM | POA: Diagnosis not present

## 2023-03-05 DIAGNOSIS — Z3A01 Less than 8 weeks gestation of pregnancy: Secondary | ICD-10-CM | POA: Diagnosis not present

## 2023-03-05 NOTE — Progress Notes (Unsigned)
U

## 2023-03-05 NOTE — Progress Notes (Signed)
[place] 7+5 wks,single IUP with yolk sac,CRL 13.81 mm,FHR 162 bpm,normal ovaries

## 2023-04-15 LAB — OB RESULTS CONSOLE HIV ANTIBODY (ROUTINE TESTING): HIV: NONREACTIVE

## 2023-04-15 LAB — OB RESULTS CONSOLE HEPATITIS B SURFACE ANTIGEN: Hepatitis B Surface Ag: NEGATIVE

## 2023-04-15 LAB — OB RESULTS CONSOLE ABO/RH: RH Type: POSITIVE

## 2023-04-15 LAB — OB RESULTS CONSOLE ANTIBODY SCREEN: Antibody Screen: NEGATIVE

## 2023-04-15 LAB — OB RESULTS CONSOLE RUBELLA ANTIBODY, IGM: Rubella: IMMUNE

## 2023-04-15 LAB — HEPATITIS C ANTIBODY: HCV Ab: NEGATIVE

## 2023-04-15 LAB — OB RESULTS CONSOLE RPR: RPR: NONREACTIVE

## 2023-04-15 LAB — CYTOLOGY - PAP: CYTOLOGY - PAP: NEGATIVE

## 2023-04-15 LAB — OB RESULTS CONSOLE GC/CHLAMYDIA
Chlamydia: NEGATIVE
Neisseria Gonorrhea: NEGATIVE

## 2023-04-17 DIAGNOSIS — B951 Streptococcus, group B, as the cause of diseases classified elsewhere: Secondary | ICD-10-CM | POA: Insufficient documentation

## 2023-07-01 ENCOUNTER — Other Ambulatory Visit: Payer: Self-pay | Admitting: Obstetrics & Gynecology

## 2023-07-01 DIAGNOSIS — Z363 Encounter for antenatal screening for malformations: Secondary | ICD-10-CM

## 2023-07-02 ENCOUNTER — Encounter: Payer: Medicaid Other | Admitting: Women's Health

## 2023-07-02 ENCOUNTER — Other Ambulatory Visit: Payer: MEDICAID

## 2023-07-03 ENCOUNTER — Ambulatory Visit: Payer: MEDICAID

## 2023-07-03 DIAGNOSIS — Z3A24 24 weeks gestation of pregnancy: Secondary | ICD-10-CM

## 2023-07-03 DIAGNOSIS — Z363 Encounter for antenatal screening for malformations: Secondary | ICD-10-CM | POA: Diagnosis not present

## 2023-07-03 NOTE — Progress Notes (Signed)
[place] 24+6 wks,cephalic,FHR 137 bpm,posterior placenta gr 0,normal ovaries,CX 3.5 cm,SVP of fluid 5.9 cm,EFW 748 g 42%,anatomy complete,no obvious abnormalities

## 2023-07-15 ENCOUNTER — Encounter: Payer: Self-pay | Admitting: Women's Health

## 2023-07-15 DIAGNOSIS — Z3490 Encounter for supervision of normal pregnancy, unspecified, unspecified trimester: Secondary | ICD-10-CM | POA: Insufficient documentation

## 2023-07-16 ENCOUNTER — Encounter: Payer: Self-pay | Admitting: Women's Health

## 2023-07-16 ENCOUNTER — Encounter: Payer: MEDICAID | Admitting: *Deleted

## 2023-07-16 ENCOUNTER — Ambulatory Visit: Payer: MEDICAID | Admitting: Women's Health

## 2023-07-16 VITALS — BP 131/78 | HR 72 | Ht 68.0 in | Wt 202.0 lb

## 2023-07-16 DIAGNOSIS — J45909 Unspecified asthma, uncomplicated: Secondary | ICD-10-CM | POA: Insufficient documentation

## 2023-07-16 DIAGNOSIS — Z3A26 26 weeks gestation of pregnancy: Secondary | ICD-10-CM | POA: Diagnosis not present

## 2023-07-16 DIAGNOSIS — Z3482 Encounter for supervision of other normal pregnancy, second trimester: Secondary | ICD-10-CM

## 2023-07-16 DIAGNOSIS — Z87440 Personal history of urinary (tract) infections: Secondary | ICD-10-CM | POA: Diagnosis not present

## 2023-07-16 DIAGNOSIS — L409 Psoriasis, unspecified: Secondary | ICD-10-CM | POA: Diagnosis not present

## 2023-07-16 DIAGNOSIS — O2342 Unspecified infection of urinary tract in pregnancy, second trimester: Secondary | ICD-10-CM

## 2023-07-16 DIAGNOSIS — F418 Other specified anxiety disorders: Secondary | ICD-10-CM | POA: Diagnosis not present

## 2023-07-16 DIAGNOSIS — B951 Streptococcus, group B, as the cause of diseases classified elsewhere: Secondary | ICD-10-CM

## 2023-07-16 DIAGNOSIS — Z3480 Encounter for supervision of other normal pregnancy, unspecified trimester: Secondary | ICD-10-CM

## 2023-07-16 NOTE — Progress Notes (Signed)
INITIAL OBSTETRICAL VISIT Patient name: Rebecca Marsh MRN [id number]  Date of birth: [birth year] Chief Complaint:   Initial Prenatal Visit  History of Present Illness:   Rebecca Marsh is a 22 y.o. G62P0030 Caucasian female at [redacted]w[redacted]d by [place] at 7 weeks with an Estimated Date of Delivery: 10/17/23 being seen today for her initial obstetrical visit w/ [place], tx from [facility] d/t move.   Patient's last menstrual period was 01/03/2023 (approximate). Her obstetrical history is significant for  SAB x 3 .   Today she reports no complaints.  Dep/anx, on buspar prior to pregnancy, stopped w/ +PT, wants to resume. [facility] q 2wks, has therapist there Psoriasis, uses VitE, helps some Last pap 04/15/23. Results were:  neg at [facility]      07/16/2023    2:19 PM 07/09/2022    1:04 PM 05/23/2022    8:03 AM 04/24/2022   10:57 AM 04/09/2022    9:20 AM  Depression screen PHQ 2/9  Decreased Interest 2 0 0 0 2  Down, Depressed, Hopeless 1 0 0 1 1  PHQ - 2 Score 3 0 0 1 3  Altered sleeping 2 0 0 0 3  Tired, decreased energy 3 0 0 0 0  Change in appetite 0 0 0 0 0  Feeling bad or failure about yourself  2 0 0 0 0  Trouble concentrating 1 0 0 0 3  Moving slowly or fidgety/restless 0 0 0 0 0  Suicidal thoughts 0 0 0 0 0  PHQ-9 Score 11 0 0 1 9  Difficult doing work/chores   Not difficult at all  Not difficult at all        07/16/2023    2:20 PM 07/09/2022    1:04 PM 05/23/2022    8:03 AM 04/24/2022   10:57 AM  GAD 7 : Generalized Anxiety Score  Nervous, Anxious, on Edge 1 1 1  0  Control/stop worrying 0 1 0 0  Worry too much - different things 2 1 0 1  Trouble relaxing 2 0 0 0  Restless 2 0 0 1  Easily annoyed or irritable 2 0 0 1  Afraid - awful might happen 0 0 0 0  Total GAD 7 Score 9 3 1 3   Anxiety Difficulty  Somewhat difficult Not difficult at all      Review of Systems:   Pertinent items are noted in HPI Denies cramping/contractions, leakage of fluid, vaginal bleeding, abnormal  vaginal discharge w/ itching/odor/irritation, headaches, visual changes, shortness of breath, chest pain, abdominal pain, severe nausea/vomiting, or problems with urination or bowel movements unless otherwise stated above.  Pertinent History Reviewed:  Reviewed past medical,surgical, social, obstetrical and family history.  Reviewed problem list, medications and allergies. OB History  Gravida Para Term Preterm AB Living  4       3 0  SAB IAB Ectopic Multiple Live Births  3            # Outcome Date GA Lbr Len/2nd Weight Sex Type Anes PTL Lv  4 Current           3 SAB           2 SAB           1 SAB            Physical Assessment:   Vitals:   07/16/23 1411 07/16/23 1412  BP: 131/78   Pulse: 72   Weight: 202 lb (91.6 kg)  Height:  5\' 8"  (1.727 m)  Body mass index is 30.71 kg/m.       Physical Examination:  General appearance - well appearing, and in no distress  Mental status - alert, oriented to person, place, and time  Psych:  She has a normal mood and affect  Skin - warm and dry, normal color, psoriasis legs/trunk/scalp  Chest - effort normal, all lung fields clear to auscultation bilaterally  Heart - normal rate and regular rhythm  Abdomen - soft, nontender  Extremities:  No swelling or varicosities noted  Pelvic - VULVA: normal appearing vulva with no masses, tenderness or lesions  VAGINA: normal appearing vagina with normal color and discharge, no lesions  CERVIX: normal appearing cervix without discharge or lesions, no CMT  Thin prep pap is not done   Chaperone: N/A    TODAY'S FH: 26cm  FHR: 147  No results found for this or any previous visit (from the past 24 hour(s)).  Assessment & Plan:  1) Low-Risk Pregnancy G4P0030 at [redacted]w[redacted]d with an Estimated Date of Delivery: 10/17/23   2) Initial OB visit  3) Transfer of care  4) Psoriasis  5) Dep/anx> ok to resume buspar, continue appt w/ [facility] q2wks, has therapist there  6) GBS bacteruria> POC urine cx  today  Meds: No orders of the defined types were placed in this encounter.   Chart reviewed Continue prenatal vitamins Reviewed ptl s/s, fm Reviewed recommended weight gain based on pre-gravid BMI Encouraged well-balanced diet Genetic & carrier screening discussed: neg per pt at [facility], will request records Ultrasound discussed; fetal survey: results reviewed CCNC completed> form faxed if has or is planning to apply for medicaid The nature of [place] [facility] [employer] with multiple MDs and other Advanced Practice Providers was explained to patient; also emphasized that fellows, residents, and students are part of our team. Does not have home bp cuff. Office bp cuff given: yes. Rx sent: no. Check bp weekly, let us know if consistently >140/90.   Follow-up: Return for next 1-2wks for PN2 (no visit), 4wks LROB w/ CNM.   Orders Placed This Encounter  Procedures   Urine Culture   OB RESULTS CONSOLE RPR   OB RESULTS CONSOLE HIV antibody   OB RESULTS CONSOLE Rubella Antibody   OB RESULTS CONSOLE Hepatitis B surface antigen   Hepatitis C antibody   OB RESULTS CONSOLE GC/Chlamydia   OB RESULTS CONSOLE ABO/Rh   OB RESULTS CONSOLE Antibody Screen    Cheral Marker CNM, [facility] 07/16/2023 2:57 PM

## 2023-07-16 NOTE — Patient Instructions (Signed)
Rebecca Marsh, thank you for choosing our office today! We appreciate the opportunity to meet your healthcare needs. You may receive a short survey by mail, e-mail, or through [employer]. If you are happy with your care we would appreciate if you could take just a few minutes to complete the survey questions. We read all of your comments and take your feedback very seriously. Thank you again for choosing our office.  [facility] [employer] Team at [facility]  [facility] ([address] [place], [place] [address]) Entrance C, located off of [address] Free 24/7 valet parking   You will have your sugar test next visit.  Please do not eat or drink anything after midnight the night before you come, not even water.  You will be here for at least two hours.  Please make an appointment online for the bloodwork at [email or web address] for 8:00am (or as close to this as possible). Make sure you select the [facility] service center.   CLASSES: Go to [email or web address] to register for classes (childbirth, breastfeeding, waterbirth, infant CPR, daddy bootcamp, etc.)  Call the office [phone]) or go to [facility] if: You begin to have strong, frequent contractions Your water breaks.  Sometimes it is a big gush of fluid, sometimes it is just a trickle that keeps getting your panties wet or running down your legs You have vaginal bleeding.  It is normal to have a small amount of spotting if your cervix was checked.  You don't feel your baby moving like normal.  If you don't, get you something to eat and drink and lay down and focus on feeling your baby move.   If your baby is still not moving like normal, you should call the office or go to [facility].  Call the office [phone]) or go to [facility] hospital for these signs of pre-eclampsia: Severe headache that does not go away with Tylenol Visual changes- seeing spots, double, blurred vision Pain under your right breast or upper  abdomen that does not go away with Tums or heartburn medicine Nausea and/or vomiting Severe swelling in your hands, feet, and face    [place] Pediatricians/Family Doctors [facility]): [address] Colette Ribas, [phone]           [facility]: [address], [phone]                [facility]): [address], [phone]  [facility] Department: [address], [place], [phone]    [facility] Pediatricians/Family Doctors [facility]): [address], [phone] [facility]: [address] [place], [phone] [facility]: [address], [phone]  [facility] Doctors  [facility]): [phone] [facility]: [address] [phone]   [facility] Doctors [facility]: [address] [phone]  [facility] Doctors  [facility]: [phone], [phone]  Home Blood Pressure Monitoring for Patients   Your provider has recommended that you check your blood pressure (BP) at least once a week at home. If you do not have a blood pressure cuff at home, one will be provided for you. Contact your provider if you have not received your monitor within 1 week.   Helpful Tips for Accurate Home Blood Pressure Checks  Don't smoke, exercise, or drink caffeine 30 minutes before checking  your BP Use the restroom before checking your BP (a full bladder can raise your pressure) Relax in a comfortable upright chair Feet on the ground Left arm resting comfortably on a flat surface at the level of your heart Legs uncrossed Back supported Sit quietly and don't talk Place the cuff on your bare arm Adjust snuggly, so that only two fingertips can fit between your skin and the top of the cuff Check 2 readings separated by at least one  minute Keep a log of your BP readings For a visual, please reference this diagram: http://ccnc.care/bpdiagram  Provider Name: [facility]     Phone: [phone]  Zone 1: ALL CLEAR  Continue to monitor your symptoms:  BP reading is less than 140 (top number) or less than 90 (bottom number)  No right upper stomach pain No headaches or seeing spots No feeling nauseated or throwing up No swelling in face and hands  Zone 2: CAUTION Call your doctor's office for any of the following:  BP reading is greater than 140 (top number) or greater than 90 (bottom number)  Stomach pain under your ribs in the middle or right side Headaches or seeing spots Feeling nauseated or throwing up Swelling in face and hands  Zone 3: EMERGENCY  Seek immediate medical care if you have any of the following:  BP reading is greater than160 (top number) or greater than 110 (bottom number) Severe headaches not improving with Tylenol Serious difficulty catching your breath Any worsening symptoms from Zone 2   Second Trimester of Pregnancy The second trimester is from week 13 through week 28, months 4 through 6. The second trimester is often a time when you feel your best. Your body has also adjusted to being pregnant, and you begin to feel better physically. Usually, morning sickness has lessened or quit completely, you may have more energy, and you may have an increase in appetite. The second trimester is also a time when the fetus is growing rapidly. At the end of the sixth month, the fetus is about 9 inches long and weighs about 1 pounds. You will likely begin to feel the baby move (quickening) between 18 and 20 weeks of the pregnancy. BODY CHANGES Your body goes through many changes during pregnancy. The changes vary from woman to woman.  Your weight will continue to increase. You will notice your lower abdomen bulging out. You may begin to get stretch marks on your hips, abdomen, and breasts. You may  develop headaches that can be relieved by medicines approved by your health care provider. You may urinate more often because the fetus is pressing on your bladder. You may develop or continue to have heartburn as a result of your pregnancy. You may develop constipation because certain hormones are causing the muscles that push waste through your intestines to slow down. You may develop hemorrhoids or swollen, bulging veins (varicose veins). You may have back pain because of the weight gain and pregnancy hormones relaxing your joints between the bones in your pelvis and as a result of a shift in weight and the muscles that support your balance. Your breasts will continue to grow and be tender. Your gums may bleed and may be sensitive to brushing and flossing. Dark spots or blotches (chloasma, mask of pregnancy) may develop on your face. This will likely fade after the baby is born. A dark line from your belly button to the pubic area (linea nigra) may appear. This will likely fade after the  baby is born. You may have changes in your hair. These can include thickening of your hair, rapid growth, and changes in texture. Some women also have hair loss during or after pregnancy, or hair that feels dry or thin. Your hair will most likely return to normal after your baby is born. WHAT TO EXPECT AT YOUR PRENATAL VISITS During a routine prenatal visit: You will be weighed to make sure you and the fetus are growing normally. Your blood pressure will be taken. Your abdomen will be measured to track your baby's growth. The fetal heartbeat will be listened to. Any test results from the previous visit will be discussed. Your health care provider may ask you: How you are feeling. If you are feeling the baby move. If you have had any abnormal symptoms, such as leaking fluid, bleeding, severe headaches, or abdominal cramping. If you have any questions. Other tests that may be performed during your second  trimester include: Blood tests that check for: Low iron levels (anemia). Gestational diabetes (between 24 and 28 weeks). Rh antibodies. Urine tests to check for infections, diabetes, or protein in the urine. An ultrasound to confirm the proper growth and development of the baby. An amniocentesis to check for possible genetic problems. Fetal screens for spina bifida and Down syndrome. HOME CARE INSTRUCTIONS  Avoid all smoking, herbs, alcohol, and unprescribed drugs. These chemicals affect the formation and growth of the baby. Follow your health care provider's instructions regarding medicine use. There are medicines that are either safe or unsafe to take during pregnancy. Exercise only as directed by your health care provider. Experiencing uterine cramps is a good sign to stop exercising. Continue to eat regular, healthy meals. Wear a good support bra for breast tenderness. Do not use hot tubs, steam rooms, or saunas. Wear your seat belt at all times when driving. Avoid raw meat, uncooked cheese, cat litter boxes, and soil used by cats. These carry germs that can cause birth defects in the baby. Take your prenatal vitamins. Try taking a stool softener (if your health care provider approves) if you develop constipation. Eat more high-fiber foods, such as fresh vegetables or fruit and whole grains. Drink plenty of fluids to keep your urine clear or pale yellow. Take warm sitz baths to soothe any pain or discomfort caused by hemorrhoids. Use hemorrhoid cream if your health care provider approves. If you develop varicose veins, wear support hose. Elevate your feet for 15 minutes, 3-4 times a day. Limit salt in your diet. Avoid heavy lifting, wear low heel shoes, and practice good posture. Rest with your legs elevated if you have leg cramps or low back pain. Visit your dentist if you have not gone yet during your pregnancy. Use a soft toothbrush to brush your teeth and be gentle when you floss. A  sexual relationship may be continued unless your health care provider directs you otherwise. Continue to go to all your prenatal visits as directed by your health care provider. SEEK MEDICAL CARE IF:  You have dizziness. You have mild pelvic cramps, pelvic pressure, or nagging pain in the abdominal area. You have persistent nausea, vomiting, or diarrhea. You have a bad smelling vaginal discharge. You have pain with urination. SEEK IMMEDIATE MEDICAL CARE IF:  You have a fever. You are leaking fluid from your vagina. You have spotting or bleeding from your vagina. You have severe abdominal cramping or pain. You have rapid weight gain or loss. You have shortness of breath with chest pain. You  notice sudden or extreme swelling of your face, hands, ankles, feet, or legs. You have not felt your baby move in over an hour. You have severe headaches that do not go away with medicine. You have vision changes. Document Released: 12/04/2001 Document Revised: 12/15/2013 Document Reviewed: 02/10/2013 [facility] Patient Information 2015 [place], [place]. This information is not intended to replace advice given to you by your health care provider. Make sure you discuss any questions you have with your health care provider.

## 2023-07-18 ENCOUNTER — Other Ambulatory Visit: Payer: MEDICAID

## 2023-07-18 DIAGNOSIS — Z3A27 27 weeks gestation of pregnancy: Secondary | ICD-10-CM

## 2023-07-18 DIAGNOSIS — Z131 Encounter for screening for diabetes mellitus: Secondary | ICD-10-CM

## 2023-07-18 DIAGNOSIS — Z3480 Encounter for supervision of other normal pregnancy, unspecified trimester: Secondary | ICD-10-CM

## 2023-07-18 LAB — URINE CULTURE

## 2023-08-02 IMAGING — DX DG LUMBAR SPINE COMPLETE 4+V
5 series · 5 of 5 positions shown · non-contrast
Comparison: Lumbar spine radiograph dated 04/23/2005.

CLINICAL DATA: Back pain.

EXAM:
LUMBAR SPINE - COMPLETE 4+ VIEW

[l-spine ap]
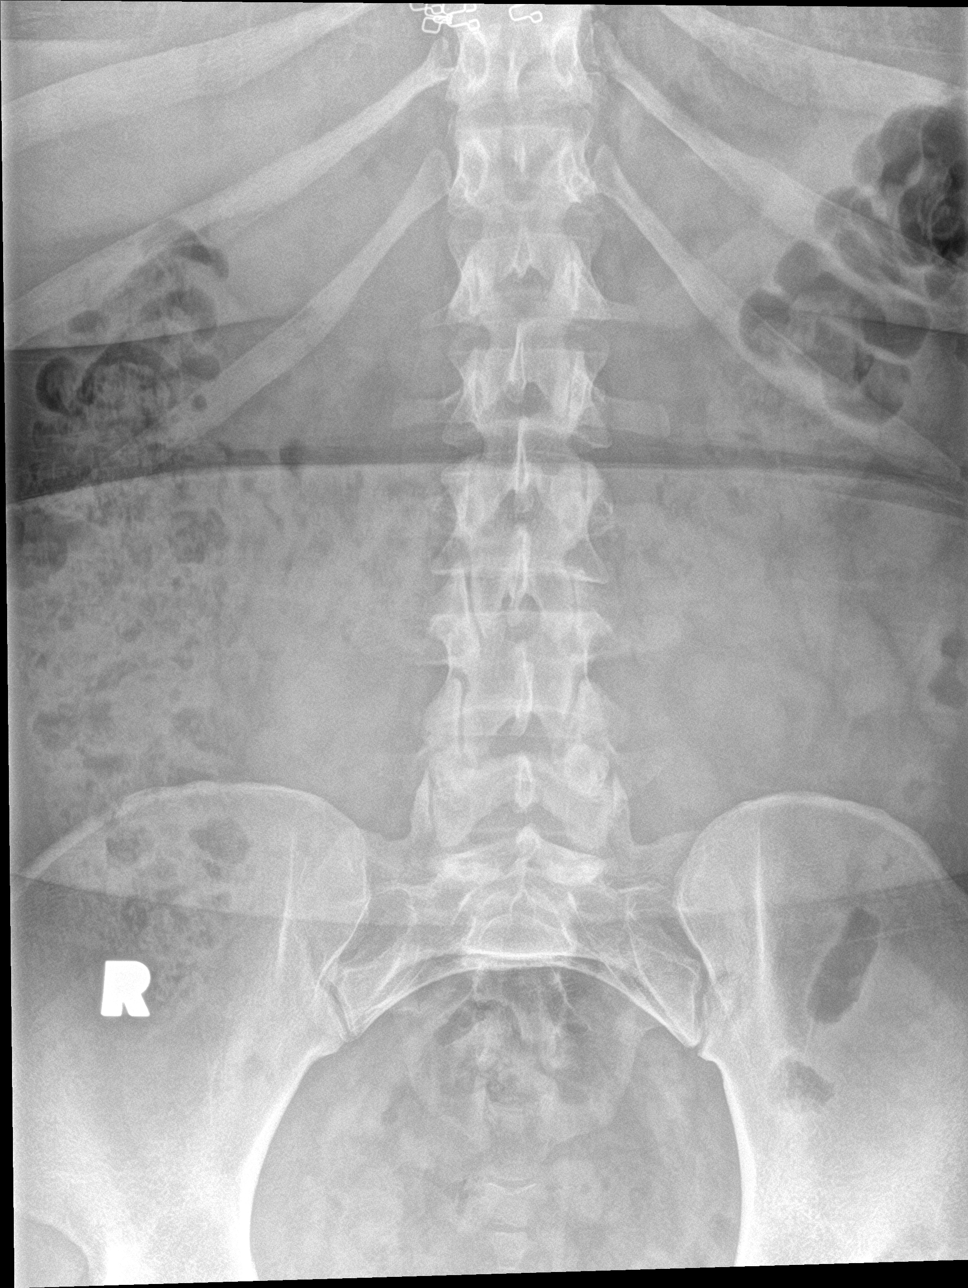

[l-spine obl (1 of 2)]
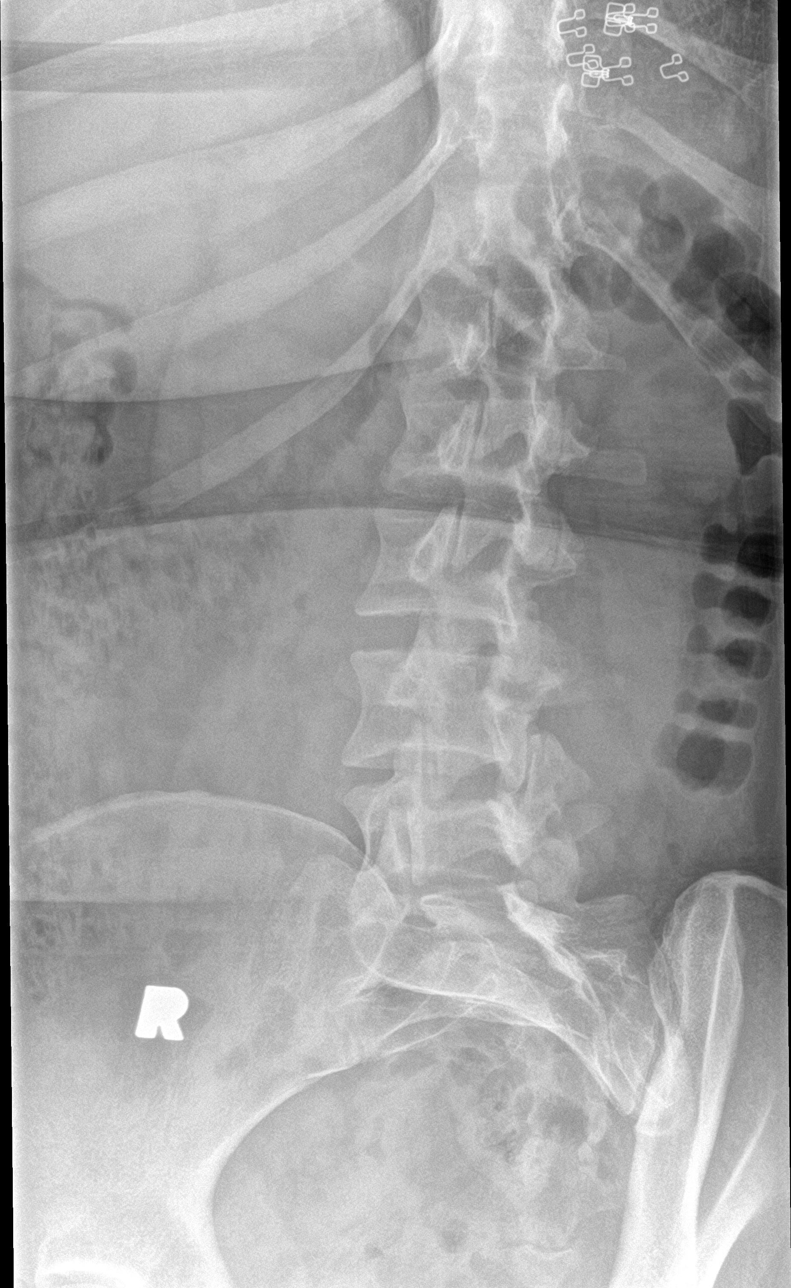

[l-spine obl (2 of 2)]
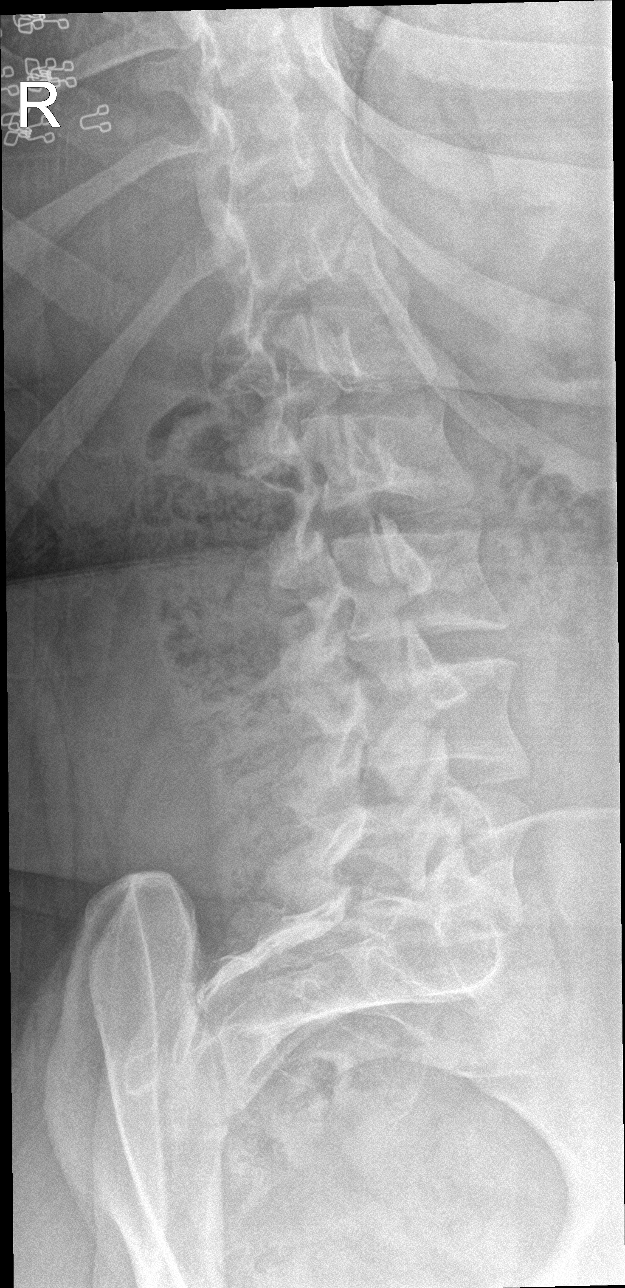

[l-spine lat]
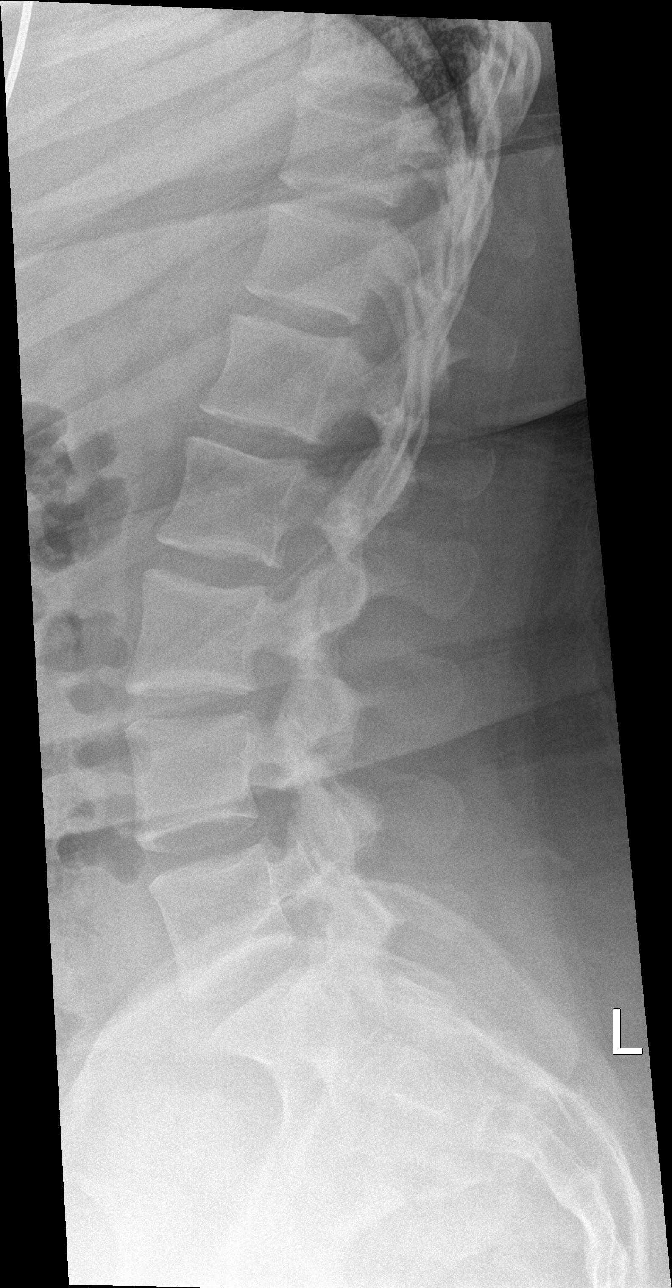

[l-spine spot]
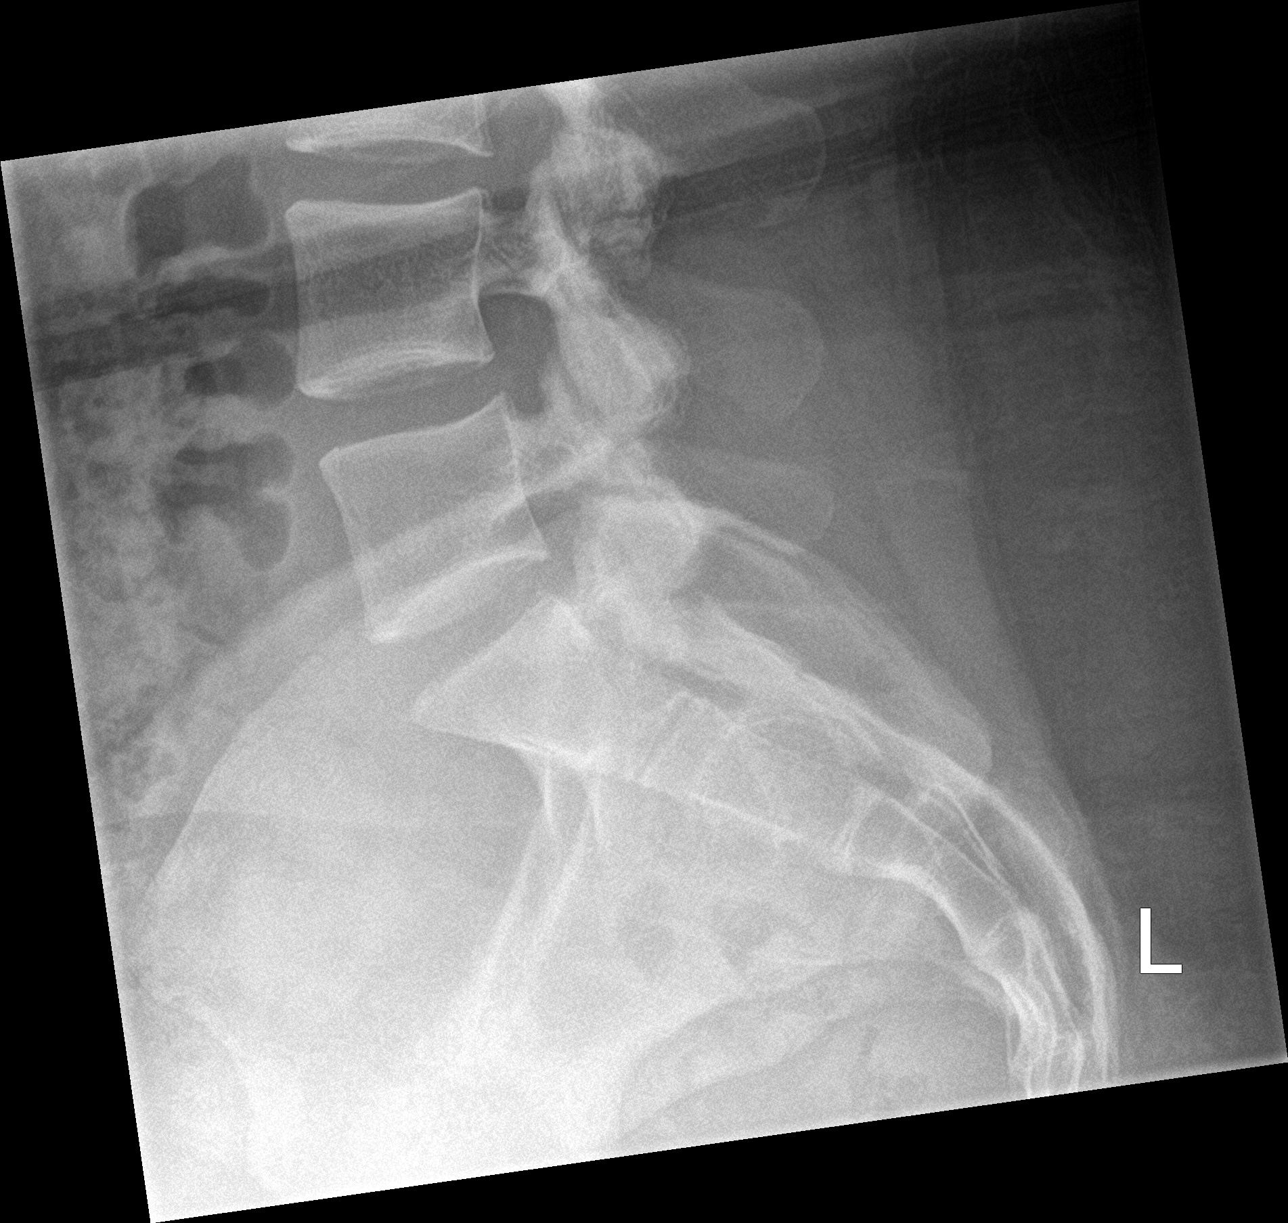

[5 of 5 positions shown; findings below may reference images not displayed]

FINDINGS: Five lumbar type vertebra. There is no acute fracture or subluxation
of the lumbar spine. The vertebral body heights and disc spaces are
maintained. The visualized posterior elements are intact. The soft
tissues are unremarkable.
IMPRESSION: Negative.

## 2023-08-13 ENCOUNTER — Encounter: Payer: Self-pay | Admitting: Women's Health

## 2023-08-13 ENCOUNTER — Ambulatory Visit (INDEPENDENT_AMBULATORY_CARE_PROVIDER_SITE_OTHER): Payer: MEDICAID | Admitting: Women's Health

## 2023-08-13 VITALS — BP 123/75 | HR 76 | Wt 206.2 lb

## 2023-08-13 DIAGNOSIS — Z23 Encounter for immunization: Secondary | ICD-10-CM | POA: Diagnosis not present

## 2023-08-13 DIAGNOSIS — Z3483 Encounter for supervision of other normal pregnancy, third trimester: Secondary | ICD-10-CM

## 2023-08-13 DIAGNOSIS — Z3480 Encounter for supervision of other normal pregnancy, unspecified trimester: Secondary | ICD-10-CM

## 2023-08-13 DIAGNOSIS — Z3A30 30 weeks gestation of pregnancy: Secondary | ICD-10-CM

## 2023-08-13 NOTE — Patient Instructions (Signed)
Rebecca Marsh, thank you for choosing our office today! We appreciate the opportunity to meet your healthcare needs. You may receive a short survey by mail, e-mail, or through MyChart. If you are happy with your care we would appreciate if you could take just a few minutes to complete the survey questions. We read all of your comments and take your feedback very seriously. Thank you again for choosing our office.  [facility]  [facility] ([address] [place], [place] [address]) Entrance C, located off of [address] Free 24/7 valet parking   CLASSES: Go to [email or web address] to register for classes (childbirth, breastfeeding, waterbirth, infant CPR, daddy bootcamp, etc.)  Call the office ([phone]) or go to [facility] if: You begin to have strong, frequent contractions Your water breaks.  Sometimes it is a big gush of fluid, sometimes it is just a trickle that keeps getting your panties wet or running down your legs You have vaginal bleeding.  It is normal to have a small amount of spotting if your cervix was checked.  You don't feel your baby moving like normal.  If you don't, get you something to eat and drink and lay down and focus on feeling your baby move.   If your baby is still not moving like normal, you should call the office or go to [facility].  Call the office ([phone]) or go to [facility] for these signs of pre-eclampsia: Severe headache that does not go away with Tylenol Visual changes- seeing spots, double, blurred vision Pain under your right breast or upper abdomen that does not go away with Tums or heartburn medicine Nausea and/or vomiting Severe swelling in your hands, feet, and face   Tdap Vaccine It is recommended that you get the Tdap vaccine during the third trimester of EACH pregnancy to help protect your baby from getting pertussis (whooping cough) 27-36 weeks is the BEST time to do  this so that you can pass the protection on to your baby. During pregnancy is better than after pregnancy, but if you are unable to get it during pregnancy it will be offered at the hospital.  You can get this vaccine with us, at the health department, your family doctor, or some local pharmacies Everyone who will be around your baby should also be up-to-date on their vaccines before the baby comes. Adults (who are not pregnant) only need 1 dose of Tdap during adulthood.   [place] Pediatricians/Family Doctors [facility] ([facility]): [address], [phone]           [facility]: [address], [phone]                 [facility] ([facility]): [address], [phone] (call to ask if accepting patients) [facility]: [address], [place], [phone]    [place] Pediatricians/Family Doctors [facility] ([facility]): [address], [phone] [facility]: [address], [phone] [facility]: [address], [phone]  [place] [facility]  [facility] ([facility]): [phone] [facility]: [address], [phone]   [place] [facility]: [address], [phone]  [place] [facility]  [facility]: [address], [phone]  Home Blood Pressure Monitoring for Patients   Your provider has recommended that you check your   blood pressure (BP) at least once a week at home. If you do not have a blood pressure cuff at home, one will be provided for you. Contact your provider if you have not received your monitor within 1 week.   Helpful Tips for Accurate Home Blood Pressure Checks  Don't smoke, exercise, or drink caffeine 30 minutes before checking your BP Use the restroom before checking your BP (a full bladder can raise your  pressure) Relax in a comfortable upright chair Feet on the ground Left arm resting comfortably on a flat surface at the level of your heart Legs uncrossed Back supported Sit quietly and don't talk Place the cuff on your bare arm Adjust snuggly, so that only two fingertips can fit between your skin and the top of the cuff Check 2 readings separated by at least one minute Keep a log of your BP readings For a visual, please reference this diagram: http://ccnc.care/bpdiagram  Provider Name: [facility]     Phone: [phone]  Zone 1: ALL CLEAR  Continue to monitor your symptoms:  BP reading is less than 140 (top number) or less than 90 (bottom number)  No right upper stomach pain No headaches or seeing spots No feeling nauseated or throwing up No swelling in face and hands  Zone 2: CAUTION Call your doctor's office for any of the following:  BP reading is greater than 140 (top number) or greater than 90 (bottom number)  Stomach pain under your ribs in the middle or right side Headaches or seeing spots Feeling nauseated or throwing up Swelling in face and hands  Zone 3: EMERGENCY  Seek immediate medical care if you have any of the following:  BP reading is greater than160 (top number) or greater than 110 (bottom number) Severe headaches not improving with Tylenol Serious difficulty catching your breath Any worsening symptoms from Zone 2   Third Trimester of Pregnancy The third trimester is from week 29 through week 42, months 7 through 9. The third trimester is a time when the fetus is growing rapidly. At the end of the ninth month, the fetus is about 20 inches in length and weighs 6-10 pounds.  BODY CHANGES Your body goes through many changes during pregnancy. The changes vary from woman to woman.  Your weight will continue to increase. You can expect to gain 25-35 pounds (11-16 kg) by the end of the pregnancy. You may begin to get stretch marks on your hips, abdomen,  and breasts. You may urinate more often because the fetus is moving lower into your pelvis and pressing on your bladder. You may develop or continue to have heartburn as a result of your pregnancy. You may develop constipation because certain hormones are causing the muscles that push waste through your intestines to slow down. You may develop hemorrhoids or swollen, bulging veins (varicose veins). You may have pelvic pain because of the weight gain and pregnancy hormones relaxing your joints between the bones in your pelvis. Backaches may result from overexertion of the muscles supporting your posture. You may have changes in your hair. These can include thickening of your hair, rapid growth, and changes in texture. Some women also have hair loss during or after pregnancy, or hair that feels dry or thin. Your hair will most likely return to normal after your baby is born. Your breasts will continue to grow and be tender. A yellow discharge may leak from your breasts called colostrum. Your belly button may stick out. You may   feel short of breath because of your expanding uterus. You may notice the fetus "dropping," or moving lower in your abdomen. You may have a bloody mucus discharge. This usually occurs a few days to a week before labor begins. Your cervix becomes thin and soft (effaced) near your due date. WHAT TO EXPECT AT YOUR PRENATAL EXAMS  You will have prenatal exams every 2 weeks until week 36. Then, you will have weekly prenatal exams. During a routine prenatal visit: You will be weighed to make sure you and the fetus are growing normally. Your blood pressure is taken. Your abdomen will be measured to track your baby's growth. The fetal heartbeat will be listened to. Any test results from the previous visit will be discussed. You may have a cervical check near your due date to see if you have effaced. At around 36 weeks, your caregiver will check your cervix. At the same time, your  caregiver will also perform a test on the secretions of the vaginal tissue. This test is to determine if a type of bacteria, Group B streptococcus, is present. Your caregiver will explain this further. Your caregiver may ask you: What your birth plan is. How you are feeling. If you are feeling the baby move. If you have had any abnormal symptoms, such as leaking fluid, bleeding, severe headaches, or abdominal cramping. If you have any questions. Other tests or screenings that may be performed during your third trimester include: Blood tests that check for low iron levels (anemia). Fetal testing to check the health, activity level, and growth of the fetus. Testing is done if you have certain medical conditions or if there are problems during the pregnancy. FALSE LABOR You may feel small, irregular contractions that eventually go away. These are called Braxton Hicks contractions, or false labor. Contractions may last for hours, days, or even weeks before true labor sets in. If contractions come at regular intervals, intensify, or become painful, it is best to be seen by your caregiver.  SIGNS OF LABOR  Menstrual-like cramps. Contractions that are 5 minutes apart or less. Contractions that start on the top of the uterus and spread down to the lower abdomen and back. A sense of increased pelvic pressure or back pain. A watery or bloody mucus discharge that comes from the vagina. If you have any of these signs before the 37th week of pregnancy, call your caregiver right away. You need to go to the hospital to get checked immediately. HOME CARE INSTRUCTIONS  Avoid all smoking, herbs, alcohol, and unprescribed drugs. These chemicals affect the formation and growth of the baby. Follow your caregiver's instructions regarding medicine use. There are medicines that are either safe or unsafe to take during pregnancy. Exercise only as directed by your caregiver. Experiencing uterine cramps is a good sign to  stop exercising. Continue to eat regular, healthy meals. Wear a good support bra for breast tenderness. Do not use hot tubs, steam rooms, or saunas. Wear your seat belt at all times when driving. Avoid raw meat, uncooked cheese, cat litter boxes, and soil used by cats. These carry germs that can cause birth defects in the baby. Take your prenatal vitamins. Try taking a stool softener (if your caregiver approves) if you develop constipation. Eat more high-fiber foods, such as fresh vegetables or fruit and whole grains. Drink plenty of fluids to keep your urine clear or pale yellow. Take warm sitz baths to soothe any pain or discomfort caused by hemorrhoids. Use hemorrhoid cream if   your caregiver approves. If you develop varicose veins, wear support hose. Elevate your feet for 15 minutes, 3-4 times a day. Limit salt in your diet. Avoid heavy lifting, wear low heal shoes, and practice good posture. Rest a lot with your legs elevated if you have leg cramps or low back pain. Visit your dentist if you have not gone during your pregnancy. Use a soft toothbrush to brush your teeth and be gentle when you floss. A sexual relationship may be continued unless your caregiver directs you otherwise. Do not travel far distances unless it is absolutely necessary and only with the approval of your caregiver. Take prenatal classes to understand, practice, and ask questions about the labor and delivery. Make a trial run to the hospital. Pack your hospital bag. Prepare the baby's nursery. Continue to go to all your prenatal visits as directed by your caregiver. SEEK MEDICAL CARE IF: You are unsure if you are in labor or if your water has broken. You have dizziness. You have mild pelvic cramps, pelvic pressure, or nagging pain in your abdominal area. You have persistent nausea, vomiting, or diarrhea. You have a bad smelling vaginal discharge. You have pain with urination. SEEK IMMEDIATE MEDICAL CARE IF:  You  have a fever. You are leaking fluid from your vagina. You have spotting or bleeding from your vagina. You have severe abdominal cramping or pain. You have rapid weight loss or gain. You have shortness of breath with chest pain. You notice sudden or extreme swelling of your face, hands, ankles, feet, or legs. You have not felt your baby move in over an hour. You have severe headaches that do not go away with medicine. You have vision changes. Document Released: 12/04/2001 Document Revised: 12/15/2013 Document Reviewed: 02/10/2013 ExitCare Patient Information 2015 [employer]. This information is not intended to replace advice given to you by your health care provider. Make sure you discuss any questions you have with your health care provider.       

## 2023-08-13 NOTE — Progress Notes (Signed)
LOW-RISK PREGNANCY VISIT Patient name: Rebecca Marsh MRN [id number]  Date of birth: 2001/06/17 Chief Complaint:   Routine Prenatal Visit (Tdap today)  History of Present Illness:   Rebecca Marsh is a 22 y.o. G42P0030 female at [redacted]w[redacted]d with an Estimated Date of Delivery: 10/17/23 being seen today for ongoing management of a low-risk pregnancy.   Today she reports occasional contractions. Contractions: Irritability. Vag. Bleeding: None.  Movement: Present. denies leaking of fluid.     07/16/2023    2:19 PM 07/09/2022    1:04 PM 05/23/2022    8:03 AM 04/24/2022   10:57 AM 04/09/2022    9:20 AM  Depression screen PHQ 2/9  Decreased Interest 2 0 0 0 2  Down, Depressed, Hopeless 1 0 0 1 1  PHQ - 2 Score 3 0 0 1 3  Altered sleeping 2 0 0 0 3  Tired, decreased energy 3 0 0 0 0  Change in appetite 0 0 0 0 0  Feeling bad or failure about yourself  2 0 0 0 0  Trouble concentrating 1 0 0 0 3  Moving slowly or fidgety/restless 0 0 0 0 0  Suicidal thoughts 0 0 0 0 0  PHQ-9 Score 11 0 0 1 9  Difficult doing work/chores   Not difficult at all  Not difficult at all        07/16/2023    2:20 PM 07/09/2022    1:04 PM 05/23/2022    8:03 AM 04/24/2022   10:57 AM  GAD 7 : Generalized Anxiety Score  Nervous, Anxious, on Edge 1 1 1  0  Control/stop worrying 0 1 0 0  Worry too much - different things 2 1 0 1  Trouble relaxing 2 0 0 0  Restless 2 0 0 1  Easily annoyed or irritable 2 0 0 1  Afraid - awful might happen 0 0 0 0  Total GAD 7 Score 9 3 1 3   Anxiety Difficulty  Somewhat difficult Not difficult at all       Review of Systems:   Pertinent items are noted in HPI Denies abnormal vaginal discharge w/ itching/odor/irritation, headaches, visual changes, shortness of breath, chest pain, abdominal pain, severe nausea/vomiting, or problems with urination or bowel movements unless otherwise stated above. Pertinent History Reviewed:  Reviewed past medical,surgical, social, obstetrical and  family history.  Reviewed problem list, medications and allergies. Physical Assessment:   Vitals:   08/13/23 1510  BP: 123/75  Pulse: 76  Weight: 206 lb 3.2 oz (93.5 kg)  Body mass index is 31.35 kg/m.        Physical Examination:   General appearance: Well appearing, and in no distress  Mental status: Alert, oriented to person, place, and time  Skin: Warm & dry  Cardiovascular: Normal heart rate noted  Respiratory: Normal respiratory effort, no distress  Abdomen: Soft, gravid, nontender  Pelvic: Cervical exam deferred         Extremities: Edema: None  Fetal Status: Fetal Heart Rate (bpm): 150 Fundal Height: 30 cm Movement: Present    Chaperone: N/A   No results found for this or any previous visit (from the past 24 hour(s)).  Assessment & Plan:  1) Low-risk pregnancy G4P0030 at [redacted]w[redacted]d with an Estimated Date of Delivery: 10/17/23    Meds: No orders of the defined types were placed in this encounter.  Labs/procedures today: tdap  Plan:  Continue routine obstetrical care  Next visit: prefers in person  Reviewed: Preterm labor symptoms and general obstetric precautions including but not limited to vaginal bleeding, contractions, leaking of fluid and fetal movement were reviewed in detail with the patient.  All questions were answered. Does have home bp cuff. Office bp cuff given: not applicable. Check bp weekly, let us know if consistently >140 and/or >90.  Follow-up: Return for As scheduled.  Future Appointments  Date Time Provider Department Center  08/27/2023  3:10 PM Cheral Marker, [place] [facility]  09/10/2023  3:10 PM Cheral Marker, CNM [facility]  09/24/2023  3:10 PM Cheral Marker, CNM [facility]    Orders Placed This Encounter  Procedures   Tdap vaccine greater than or equal to 7yo IM   Cheral Marker CNM, [facility] 08/13/2023 3:22 PM

## 2023-08-27 ENCOUNTER — Encounter: Payer: Self-pay | Admitting: Women's Health

## 2023-08-27 ENCOUNTER — Ambulatory Visit (INDEPENDENT_AMBULATORY_CARE_PROVIDER_SITE_OTHER): Payer: MEDICAID | Admitting: Women's Health

## 2023-08-27 VITALS — BP 113/80 | HR 58 | Wt 202.0 lb

## 2023-08-27 DIAGNOSIS — Z3483 Encounter for supervision of other normal pregnancy, third trimester: Secondary | ICD-10-CM

## 2023-08-27 DIAGNOSIS — Z3A32 32 weeks gestation of pregnancy: Secondary | ICD-10-CM

## 2023-08-27 DIAGNOSIS — Z3480 Encounter for supervision of other normal pregnancy, unspecified trimester: Secondary | ICD-10-CM

## 2023-08-27 NOTE — Patient Instructions (Signed)
Rebecca Marsh, thank you for choosing our office today! We appreciate the opportunity to meet your healthcare needs. You may receive a short survey by mail, e-mail, or through MyChart. If you are happy with your care we would appreciate if you could take just a few minutes to complete the survey questions. We read all of your comments and take your feedback very seriously. Thank you again for choosing our office.  [facility] ([address] [place], [place] [address]) Entrance C, located off of [address] Free 24/7 valet parking   CLASSES: Go to [email or web address] to register for classes (childbirth, breastfeeding, waterbirth, infant CPR, daddy bootcamp, etc.)  Call the office ([phone]) or go to [facility] if: You begin to have strong, frequent contractions Your water breaks.  Sometimes it is a big gush of fluid, sometimes it is just a trickle that keeps getting your panties wet or running down your legs You have vaginal bleeding.  It is normal to have a small amount of spotting if your cervix was checked.  You don't feel your baby moving like normal.  If you don't, get you something to eat and drink and lay down and focus on feeling your baby move.   If your baby is still not moving like normal, you should call the office or go to [facility].  Call the office ([phone]) or go to [facility] for these signs of pre-eclampsia: Severe headache that does not go away with Tylenol Visual changes- seeing spots, double, blurred vision Pain under your right breast or upper abdomen that does not go away with Tums or heartburn medicine Nausea and/or vomiting Severe swelling in your hands, feet, and face   Tdap Vaccine It is recommended that you get the Tdap vaccine during the third trimester of EACH pregnancy to help protect your baby from getting pertussis (whooping cough) 27-36 weeks is the BEST time to do  this so that you can pass the protection on to your baby. During pregnancy is better than after pregnancy, but if you are unable to get it during pregnancy it will be offered at the hospital.  You can get this vaccine with us, at the health department, your family doctor, or some local pharmacies Everyone who will be around your baby should also be up-to-date on their vaccines before the baby comes. Adults (who are not pregnant) only need 1 dose of Tdap during adulthood.   [place] Pediatricians/Family Doctors [facility] ([facility]): [address], [phone]           [facility]: [address], [phone]                [facility] ([facility]): [address], [phone] (call to ask if accepting patients) [facility]: [address], [place], [phone]    [place] Pediatricians/Family Doctors [facility] ([facility]): [address], [phone] [facility]: [address], [phone] [facility]: [address], [phone]  [place] [facility]  [facility] ([facility]): [phone] [facility]: [address], [phone]   [place] [facility]: [address], [phone]  [place] [facility]  [facility]: [phone], [phone]  Home Blood Pressure Monitoring for Patients   Your provider has recommended that you check your   blood pressure (BP) at least once a week at home. If you do not have a blood pressure cuff at home, one will be provided for you. Contact your provider if you have not received your monitor within 1 week.   Helpful Tips for Accurate Home Blood Pressure Checks  Don't smoke, exercise, or drink caffeine 30 minutes before checking your BP Use the restroom before checking your BP (a full bladder can raise your  pressure) Relax in a comfortable upright chair Feet on the ground Left arm resting comfortably on a flat surface at the level of your heart Legs uncrossed Back supported Sit quietly and don't talk Place the cuff on your bare arm Adjust snuggly, so that only two fingertips can fit between your skin and the top of the cuff Check 2 readings separated by at least one minute Keep a log of your BP readings For a visual, please reference this diagram: http://ccnc.care/bpdiagram  Provider Name: [facility]     Phone: [phone]  Zone 1: ALL CLEAR  Continue to monitor your symptoms:  BP reading is less than 140 (top number) or less than 90 (bottom number)  No right upper stomach pain No headaches or seeing spots No feeling nauseated or throwing up No swelling in face and hands  Zone 2: CAUTION Call your doctor's office for any of the following:  BP reading is greater than 140 (top number) or greater than 90 (bottom number)  Stomach pain under your ribs in the middle or right side Headaches or seeing spots Feeling nauseated or throwing up Swelling in face and hands  Zone 3: EMERGENCY  Seek immediate medical care if you have any of the following:  BP reading is greater than160 (top number) or greater than 110 (bottom number) Severe headaches not improving with Tylenol Serious difficulty catching your breath Any worsening symptoms from Zone 2  Preterm Labor and Birth Information  The normal length of a pregnancy is 39-41 weeks. Preterm labor is when labor starts before 37 completed weeks of pregnancy. What are the risk factors for preterm labor? Preterm labor is more likely to occur in women who: Have certain infections during pregnancy such as a bladder infection, sexually transmitted infection, or infection inside the uterus (chorioamnionitis). Have a shorter-than-normal cervix. Have gone into preterm labor before. Have had surgery on their cervix. Are younger than age 17  or older than age 35. Are African American. Are pregnant with twins or multiple babies (multiple gestation). Take street drugs or smoke while pregnant. Do not gain enough weight while pregnant. Became pregnant shortly after having been pregnant. What are the symptoms of preterm labor? Symptoms of preterm labor include: Cramps similar to those that can happen during a menstrual period. The cramps may happen with diarrhea. Pain in the abdomen or lower back. Regular uterine contractions that may feel like tightening of the abdomen. A feeling of increased pressure in the pelvis. Increased watery or bloody mucus discharge from the vagina. Water breaking (ruptured amniotic sac). Why is it important to recognize signs of preterm labor? It is important to recognize signs of preterm labor because babies who are born prematurely may not be fully developed. This can put them at an increased risk for: Long-term (chronic) heart and lung problems. Difficulty immediately after birth with regulating body systems, including blood sugar, body temperature, heart rate, and breathing rate. Bleeding in the brain. Cerebral palsy. Learning difficulties. Death. These risks are highest for babies who are born before 34 weeks   of pregnancy. How is preterm labor treated? Treatment depends on the length of your pregnancy, your condition, and the health of your baby. It may involve: Having a stitch (suture) placed in your cervix to prevent your cervix from opening too early (cerclage). Taking or being given medicines, such as: Hormone medicines. These may be given early in pregnancy to help support the pregnancy. Medicine to stop contractions. Medicines to help mature the baby's lungs. These may be prescribed if the risk of delivery is high. Medicines to prevent your baby from developing cerebral palsy. If the labor happens before 34 weeks of pregnancy, you may need to stay in the hospital. What should I do if I  think I am in preterm labor? If you think that you are going into preterm labor, call your health care provider right away. How can I prevent preterm labor in future pregnancies? To increase your chance of having a full-term pregnancy: Do not use any tobacco products, such as cigarettes, chewing tobacco, and e-cigarettes. If you need help quitting, ask your health care provider. Do not use street drugs or medicines that have not been prescribed to you during your pregnancy. Talk with your health care provider before taking any herbal supplements, even if you have been taking them regularly. Make sure you gain a healthy amount of weight during your pregnancy. Watch for infection. If you think that you might have an infection, get it checked right away. Make sure to tell your health care provider if you have gone into preterm labor before. This information is not intended to replace advice given to you by your health care provider. Make sure you discuss any questions you have with your health care provider. Document Revised: 04/03/2019 Document Reviewed: 05/02/2016 Elsevier Patient Education  2020 Elsevier Inc.   

## 2023-08-27 NOTE — Progress Notes (Signed)
LOW-RISK PREGNANCY VISIT Patient name: Rebecca Marsh MRN [id number]  Date of birth: July 07, 2001 Chief Complaint:   No chief complaint on file.  History of Present Illness:   Rebecca Marsh is a 22 y.o. G61P0030 female at [redacted]w[redacted]d with an Estimated Date of Delivery: 10/17/23 being seen today for ongoing management of a low-risk pregnancy.   Today she reports no complaints. Contractions: Not present.  .  Movement: Present. denies leaking of fluid.     07/16/2023    2:19 PM 07/09/2022    1:04 PM 05/23/2022    8:03 AM 04/24/2022   10:57 AM 04/09/2022    9:20 AM  Depression screen PHQ 2/9  Decreased Interest 2 0 0 0 2  Down, Depressed, Hopeless 1 0 0 1 1  PHQ - 2 Score 3 0 0 1 3  Altered sleeping 2 0 0 0 3  Tired, decreased energy 3 0 0 0 0  Change in appetite 0 0 0 0 0  Feeling bad or failure about yourself  2 0 0 0 0  Trouble concentrating 1 0 0 0 3  Moving slowly or fidgety/restless 0 0 0 0 0  Suicidal thoughts 0 0 0 0 0  PHQ-9 Score 11 0 0 1 9  Difficult doing work/chores   Not difficult at all  Not difficult at all        07/16/2023    2:20 PM 07/09/2022    1:04 PM 05/23/2022    8:03 AM 04/24/2022   10:57 AM  GAD 7 : Generalized Anxiety Score  Nervous, Anxious, on Edge 1 1 1  0  Control/stop worrying 0 1 0 0  Worry too much - different things 2 1 0 1  Trouble relaxing 2 0 0 0  Restless 2 0 0 1  Easily annoyed or irritable 2 0 0 1  Afraid - awful might happen 0 0 0 0  Total GAD 7 Score 9 3 1 3   Anxiety Difficulty  Somewhat difficult Not difficult at all       Review of Systems:   Pertinent items are noted in HPI Denies abnormal vaginal discharge w/ itching/odor/irritation, headaches, visual changes, shortness of breath, chest pain, abdominal pain, severe nausea/vomiting, or problems with urination or bowel movements unless otherwise stated above. Pertinent History Reviewed:  Reviewed past medical,surgical, social, obstetrical and family history.  Reviewed problem  list, medications and allergies. Physical Assessment:   Vitals:   08/27/23 1507  BP: 113/80  Pulse: (!) 58  Weight: 202 lb (91.6 kg)  Body mass index is 30.71 kg/m.        Physical Examination:   General appearance: Well appearing, and in no distress  Mental status: Alert, oriented to person, place, and time  Skin: Warm & dry  Cardiovascular: Normal heart rate noted  Respiratory: Normal respiratory effort, no distress  Abdomen: Soft, gravid, nontender  Pelvic: Cervical exam deferred         Extremities: Edema: Trace  Fetal Status: Fetal Heart Rate (bpm): 160 Fundal Height: 32 cm Movement: Present    Chaperone: N/A   No results found for this or any previous visit (from the past 24 hour(s)).  Assessment & Plan:  1) Low-risk pregnancy G4P0030 at [redacted]w[redacted]d with an Estimated Date of Delivery: 10/17/23    Meds: No orders of the defined types were placed in this encounter.  Labs/procedures today: none  Plan:  Continue routine obstetrical care  Next visit: prefers in person    Reviewed:  Preterm labor symptoms and general obstetric precautions including but not limited to vaginal bleeding, contractions, leaking of fluid and fetal movement were reviewed in detail with the patient.  All questions were answered. Does have home bp cuff. Office bp cuff given: not applicable. Check bp weekly, let us know if consistently >140 and/or >90.  Follow-up: Return for As scheduled.  Future Appointments  Date Time Provider Department Center  09/10/2023  2:50 PM Cheral Marker, CNM [facility]  09/24/2023  3:10 PM Cheral Marker, CNM [facility]    No orders of the defined types were placed in this encounter.  Cheral Marker CNM, [facility] 08/27/2023 3:21 PM

## 2023-09-10 ENCOUNTER — Ambulatory Visit (INDEPENDENT_AMBULATORY_CARE_PROVIDER_SITE_OTHER): Payer: MEDICAID | Admitting: Women's Health

## 2023-09-10 ENCOUNTER — Encounter: Payer: Self-pay | Admitting: Women's Health

## 2023-09-10 VITALS — BP 122/78 | HR 88 | Wt 201.2 lb

## 2023-09-10 DIAGNOSIS — Z3483 Encounter for supervision of other normal pregnancy, third trimester: Secondary | ICD-10-CM

## 2023-09-10 DIAGNOSIS — Z3480 Encounter for supervision of other normal pregnancy, unspecified trimester: Secondary | ICD-10-CM

## 2023-09-10 NOTE — Progress Notes (Signed)
LOW-RISK PREGNANCY VISIT Patient name: Rebecca Marsh MRN [id number]  Date of birth: 08-07-2001 Chief Complaint:   Routine Prenatal Visit  History of Present Illness:   Rebecca Marsh is a 22 y.o. G14P0030 female at [redacted]w[redacted]d with an Estimated Date of Delivery: 10/17/23 being seen today for ongoing management of a low-risk pregnancy.   Today she reports  sharp pain in epigastric pain that shoots straight to same point in back only when laying down at night. TUMS doesn't really help. Isn't after meals, no n/v.  . Contractions: Irregular.  .  Movement: Present. denies leaking of fluid.     07/16/2023    2:19 PM 07/09/2022    1:04 PM 05/23/2022    8:03 AM 04/24/2022   10:57 AM 04/09/2022    9:20 AM  Depression screen PHQ 2/9  Decreased Interest 2 0 0 0 2  Down, Depressed, Hopeless 1 0 0 1 1  PHQ - 2 Score 3 0 0 1 3  Altered sleeping 2 0 0 0 3  Tired, decreased energy 3 0 0 0 0  Change in appetite 0 0 0 0 0  Feeling bad or failure about yourself  2 0 0 0 0  Trouble concentrating 1 0 0 0 3  Moving slowly or fidgety/restless 0 0 0 0 0  Suicidal thoughts 0 0 0 0 0  PHQ-9 Score 11 0 0 1 9  Difficult doing work/chores   Not difficult at all  Not difficult at all        07/16/2023    2:20 PM 07/09/2022    1:04 PM 05/23/2022    8:03 AM 04/24/2022   10:57 AM  GAD 7 : Generalized Anxiety Score  Nervous, Anxious, on Edge 1 1 1  0  Control/stop worrying 0 1 0 0  Worry too much - different things 2 1 0 1  Trouble relaxing 2 0 0 0  Restless 2 0 0 1  Easily annoyed or irritable 2 0 0 1  Afraid - awful might happen 0 0 0 0  Total GAD 7 Score 9 3 1 3   Anxiety Difficulty  Somewhat difficult Not difficult at all       Review of Systems:   Pertinent items are noted in HPI Denies abnormal vaginal discharge w/ itching/odor/irritation, headaches, visual changes, shortness of breath, chest pain, abdominal pain, severe nausea/vomiting, or problems with urination or bowel movements unless otherwise  stated above. Pertinent History Reviewed:  Reviewed past medical,surgical, social, obstetrical and family history.  Reviewed problem list, medications and allergies. Physical Assessment:   Vitals:   09/10/23 1453  BP: 122/78  Pulse: 88  Weight: 201 lb 3.2 oz (91.3 kg)  Body mass index is 30.59 kg/m.        Physical Examination:   General appearance: Well appearing, and in no distress  Mental status: Alert, oriented to person, place, and time  Skin: Warm & dry  Cardiovascular: Normal heart rate noted  Respiratory: Normal respiratory effort, no distress  Abdomen: Soft, gravid, nontender  Pelvic: Cervical exam deferred         Extremities: Edema: None  Fetal Status: Fetal Heart Rate (bpm): 158 Fundal Height: 32 cm Movement: Present    Chaperone: N/A   No results found for this or any previous visit (from the past 24 hour(s)).  Assessment & Plan:  1) Low-risk pregnancy G4P0030 at [redacted]w[redacted]d with an Estimated Date of Delivery: 10/17/23   2) Epigastric pain, that shoots to same  point in back only when laying down at night, no n/v and not worse after meals so gb unlikely. TUMS doesn't help, try otc antacid daily, if not helping let us know.    Meds: No orders of the defined types were placed in this encounter.  Labs/procedures today: none  Plan:  Continue routine obstetrical care  Next visit: prefers will be in person for cultures     Reviewed: Preterm labor symptoms and general obstetric precautions including but not limited to vaginal bleeding, contractions, leaking of fluid and fetal movement were reviewed in detail with the patient.  All questions were answered. Does have home bp cuff. Office bp cuff given: not applicable. Check bp weekly, let us know if consistently >140 and/or >90.  Follow-up: Return for As scheduled.  Future Appointments  Date Time Provider Department Center  09/24/2023  2:50 PM Cheral Marker, CNM [facility]    No orders of the defined types were  placed in this encounter.  Cheral Marker CNM, [facility] 09/10/2023 3:16 PM

## 2023-09-10 NOTE — Patient Instructions (Signed)
Dashonna, thank you for choosing our office today! We appreciate the opportunity to meet your healthcare needs. You may receive a short survey by mail, e-mail, or through [employer]. If you are happy with your care we would appreciate if you could take just a few minutes to complete the survey questions. We read all of your comments and take your feedback very seriously. Thank you again for choosing our office.  Center for [employer] Team at [facility]  [facility] at [facility] ([address] [place], [place] [address]) Entrance C, located off of [address] Free 24/7 valet parking   CLASSES: Go to [email or web address] to register for classes (childbirth, breastfeeding, waterbirth, infant CPR, daddy bootcamp, etc.)  Call the office [phone]) or go to [facility] if: You begin to have strong, frequent contractions Your water breaks.  Sometimes it is a big gush of fluid, sometimes it is just a trickle that keeps getting your panties wet or running down your legs You have vaginal bleeding.  It is normal to have a small amount of spotting if your cervix was checked.  You don't feel your baby moving like normal.  If you don't, get you something to eat and drink and lay down and focus on feeling your baby move.   If your baby is still not moving like normal, you should call the office or go to [facility].  Call the office [phone]) or go to [facility] hospital for these signs of pre-eclampsia: Severe headache that does not go away with Tylenol Visual changes- seeing spots, double, blurred vision Pain under your right breast or upper abdomen that does not go away with Tums or heartburn medicine Nausea and/or vomiting Severe swelling in your hands, feet, and face   Tdap Vaccine It is recommended that you get the Tdap vaccine during the third trimester of EACH pregnancy to help protect your baby from getting pertussis (whooping cough) 27-36 weeks is the BEST time to do  this so that you can pass the protection on to your baby. During pregnancy is better than after pregnancy, but if you are unable to get it during pregnancy it will be offered at the hospital.  You can get this vaccine with [place], at the health department, your family doctor, or some local pharmacies Everyone who will be around your baby should also be up-to-date on their vaccines before the baby comes. Adults (who are not pregnant) only need 1 dose of Tdap during adulthood.   [facility] Pediatricians/Family Doctors [facility]): [address] Colette Ribas, [phone]           [facility]: [address], [phone]                [facility] Medicine [facility]): [address], [phone] (call to ask if accepting patients) [facility] Department: [address], [place], [phone]    [facility] Pediatricians/Family Doctors [facility]): [phone] [address], [phone] [facility]: [address] [place], [phone] [facility]: [address], [phone]  [facility] Doctors  [facility]): [phone] [facility]: [address], [phone]   [facility] Doctors [facility]: [address] [phone]  [facility] Family Doctors  [facility]: [phone], [phone]  Home Blood Pressure Monitoring for Patients   Your provider has recommended that you check your  blood pressure (BP) at least once a week at home. If you do not have a blood pressure cuff at home, one will be provided for you. Contact your provider if you have not received your monitor within 1 week.   Helpful Tips for Accurate Home Blood Pressure Checks  Don't smoke, exercise, or drink caffeine 30 minutes before checking your BP Use the restroom before checking your BP (a full bladder can raise your  pressure) Relax in a comfortable upright chair Feet on the ground Left arm resting comfortably on a flat surface at the level of your heart Legs uncrossed Back supported Sit quietly and don't talk Place the cuff on your bare arm Adjust snuggly, so that only two fingertips can fit between your skin and the top of the cuff Check 2 readings separated by at least one minute Keep a log of your BP readings For a visual, please reference this diagram: http://ccnc.care/bpdiagram  Provider Name: [facility]     Phone: [phone]  Zone 1: ALL CLEAR  Continue to monitor your symptoms:  BP reading is less than 140 (top number) or less than 90 (bottom number)  No right upper stomach pain No headaches or seeing spots No feeling nauseated or throwing up No swelling in face and hands  Zone 2: CAUTION Call your doctor's office for any of the following:  BP reading is greater than 140 (top number) or greater than 90 (bottom number)  Stomach pain under your ribs in the middle or right side Headaches or seeing spots Feeling nauseated or throwing up Swelling in face and hands  Zone 3: EMERGENCY  Seek immediate medical care if you have any of the following:  BP reading is greater than160 (top number) or greater than 110 (bottom number) Severe headaches not improving with Tylenol Serious difficulty catching your breath Any worsening symptoms from Zone 2  Preterm Labor and Birth Information  The normal length of a pregnancy is 39-41 weeks. Preterm labor is when labor starts before 37 completed weeks of pregnancy. What are the risk factors for preterm labor? Preterm labor is more likely to occur in women who: Have certain infections during pregnancy such as a bladder infection, sexually transmitted infection, or infection inside the uterus (chorioamnionitis). Have a shorter-than-normal cervix. Have gone into preterm labor before. Have had surgery on their cervix. Are younger than age 60  or older than age 80. Are African American. Are pregnant with twins or multiple babies (multiple gestation). Take street drugs or smoke while pregnant. Do not gain enough weight while pregnant. Became pregnant shortly after having been pregnant. What are the symptoms of preterm labor? Symptoms of preterm labor include: Cramps similar to those that can happen during a menstrual period. The cramps may happen with diarrhea. Pain in the abdomen or lower back. Regular uterine contractions that may feel like tightening of the abdomen. A feeling of increased pressure in the pelvis. Increased watery or bloody mucus discharge from the vagina. Water breaking (ruptured amniotic sac). Why is it important to recognize signs of preterm labor? It is important to recognize signs of preterm labor because babies who are born prematurely may not be fully developed. This can put them at an increased risk for: Long-term (chronic) heart and lung problems. Difficulty immediately after birth with regulating body systems, including blood sugar, body temperature, heart rate, and breathing rate. Bleeding in the brain. Cerebral palsy. Learning difficulties. Death. These risks are highest for babies who are born before 34 weeks  of pregnancy. How is preterm labor treated? Treatment depends on the length of your pregnancy, your condition, and the health of your baby. It may involve: Having a stitch (suture) placed in your cervix to prevent your cervix from opening too early (cerclage). Taking or being given medicines, such as: Hormone medicines. These may be given early in pregnancy to help support the pregnancy. Medicine to stop contractions. Medicines to help mature the baby's lungs. These may be prescribed if the risk of delivery is high. Medicines to prevent your baby from developing cerebral palsy. If the labor happens before 34 weeks of pregnancy, you may need to stay in the hospital. What should I do if I  think I am in preterm labor? If you think that you are going into preterm labor, call your health care provider right away. How can I prevent preterm labor in future pregnancies? To increase your chance of having a full-term pregnancy: Do not use any tobacco products, such as cigarettes, chewing tobacco, and e-cigarettes. If you need help quitting, ask your health care provider. Do not use street drugs or medicines that have not been prescribed to you during your pregnancy. Talk with your health care provider before taking any herbal supplements, even if you have been taking them regularly. Make sure you gain a healthy amount of weight during your pregnancy. Watch for infection. If you think that you might have an infection, get it checked right away. Make sure to tell your health care provider if you have gone into preterm labor before. This information is not intended to replace advice given to you by your health care provider. Make sure you discuss any questions you have with your health care provider. Document Revised: 04/03/2019 Document Reviewed: 05/02/2016 Elsevier Patient Education  2020 [employer].

## 2023-09-24 ENCOUNTER — Other Ambulatory Visit (HOSPITAL_COMMUNITY)
Admission: RE | Admit: 2023-09-24 | Discharge: 2023-09-24 | Disposition: A | Discharge status: Home / Self Care | Payer: MEDICAID | Source: Ambulatory Visit | Attending: Women's Health | Admitting: Women's Health

## 2023-09-24 ENCOUNTER — Encounter: Payer: Self-pay | Admitting: Women's Health

## 2023-09-24 ENCOUNTER — Ambulatory Visit (INDEPENDENT_AMBULATORY_CARE_PROVIDER_SITE_OTHER): Payer: MEDICAID | Admitting: Women's Health

## 2023-09-24 VITALS — BP 143/89 | HR 106 | Wt 204.4 lb

## 2023-09-24 DIAGNOSIS — Z3A36 36 weeks gestation of pregnancy: Secondary | ICD-10-CM

## 2023-09-24 DIAGNOSIS — Z1389 Encounter for screening for other disorder: Secondary | ICD-10-CM | POA: Insufficient documentation

## 2023-09-24 DIAGNOSIS — Z3480 Encounter for supervision of other normal pregnancy, unspecified trimester: Secondary | ICD-10-CM

## 2023-09-24 DIAGNOSIS — Z3483 Encounter for supervision of other normal pregnancy, third trimester: Secondary | ICD-10-CM | POA: Diagnosis not present

## 2023-09-24 DIAGNOSIS — Z331 Pregnant state, incidental: Secondary | ICD-10-CM

## 2023-09-24 LAB — POCT URINALYSIS DIPSTICK OB
Blood, UA: NEGATIVE
Glucose, UA: NEGATIVE
Ketones, UA: NEGATIVE
Leukocytes, UA: NEGATIVE
Nitrite, UA: NEGATIVE
POC,PROTEIN,UA: NEGATIVE

## 2023-09-24 NOTE — Patient Instructions (Signed)
Jolanta, thank you for choosing our office today! We appreciate the opportunity to meet your healthcare needs. You may receive a short survey by mail, e-mail, or through MyChart. If you are happy with your care we would appreciate if you could take just a few minutes to complete the survey questions. We read all of your comments and take your feedback very seriously. Thank you again for choosing our office.  [facility] at [facility] [place] ([address] [place], [place] [address]) Entrance C, located off of [address] Free 24/7 valet parking   CLASSES: Go to [email or web address] to register for classes (childbirth, breastfeeding, waterbirth, infant CPR, daddy bootcamp, etc.)  Call the office [phone]) or go to [facility] if: You begin to have strong, frequent contractions Your water breaks.  Sometimes it is a big gush of fluid, sometimes it is just a trickle that keeps getting your panties wet or running down your legs You have vaginal bleeding.  It is normal to have a small amount of spotting if your cervix was checked.  You don't feel your baby moving like normal.  If you don't, get you something to eat and drink and lay down and focus on feeling your baby move.   If your baby is still not moving like normal, you should call the office or go to [facility].  Call the office ([phone]) or go to [facility] for these signs of pre-eclampsia: Severe headache that does not go away with Tylenol Visual changes- seeing spots, double, blurred vision Pain under your right breast or upper abdomen that does not go away with Tums or heartburn medicine Nausea and/or vomiting Severe swelling in your hands, feet, and face   Tdap Vaccine It is recommended that you get the Tdap vaccine during the third trimester of EACH pregnancy to help protect your baby from getting pertussis (whooping cough) 27-36 weeks is the BEST time to do  this so that you can pass the protection on to your baby. During pregnancy is better than after pregnancy, but if you are unable to get it during pregnancy it will be offered at the hospital.  You can get this vaccine with us, at the health department, your family doctor, or some local pharmacies Everyone who will be around your baby should also be up-to-date on their vaccines before the baby comes. Adults (who are not pregnant) only need 1 dose of Tdap during adulthood.   [place] Pediatricians/Family Doctors [facility] ([facility]): [address], [phone]           [facility]: [address], [phone]                [facility] ([facility]): [address], [phone] (call to ask if accepting patients) [facility]: [address], [place], [phone]    [place] Pediatricians/Family Doctors [facility] ([facility]): [address], [phone] [facility]: [address], [phone] [facility]: [address], [phone]  [place] Family Doctors  [facility] ([facility]): [phone] [facility]: [address], [phone]   [place] Family Doctors [facility]: [address], [phone]  [place] [facility]  [facility]: [phone], [phone]  Home Blood Pressure Monitoring for Patients   Your provider has recommended that you check your   blood pressure (BP) at least once a week at home. If you do not have a blood pressure cuff at home, one will be provided for you. Contact your provider if you have not received your monitor within 1 week.   Helpful Tips for Accurate Home Blood Pressure Checks  Don't smoke, exercise, or drink caffeine 30 minutes before checking your BP Use the restroom before checking your BP (a full bladder can raise your  pressure) Relax in a comfortable upright chair Feet on the ground Left arm resting comfortably on a flat surface at the level of your heart Legs uncrossed Back supported Sit quietly and don't talk Place the cuff on your bare arm Adjust snuggly, so that only two fingertips can fit between your skin and the top of the cuff Check 2 readings separated by at least one minute Keep a log of your BP readings For a visual, please reference this diagram: http://ccnc.care/bpdiagram  Provider Name: [facility]     Phone: [phone]  Zone 1: ALL CLEAR  Continue to monitor your symptoms:  BP reading is less than 140 (top number) or less than 90 (bottom number)  No right upper stomach pain No headaches or seeing spots No feeling nauseated or throwing up No swelling in face and hands  Zone 2: CAUTION Call your doctor's office for any of the following:  BP reading is greater than 140 (top number) or greater than 90 (bottom number)  Stomach pain under your ribs in the middle or right side Headaches or seeing spots Feeling nauseated or throwing up Swelling in face and hands  Zone 3: EMERGENCY  Seek immediate medical care if you have any of the following:  BP reading is greater than160 (top number) or greater than 110 (bottom number) Severe headaches not improving with Tylenol Serious difficulty catching your breath Any worsening symptoms from Zone 2  Preterm Labor and Birth Information  The normal length of a pregnancy is 39-41 weeks. Preterm labor is when labor starts before 37 completed weeks of pregnancy. What are the risk factors for preterm labor? Preterm labor is more likely to occur in women who: Have certain infections during pregnancy such as a bladder infection, sexually transmitted infection, or infection inside the uterus (chorioamnionitis). Have a shorter-than-normal cervix. Have gone into preterm labor before. Have had surgery on their cervix. Are younger than age 17  or older than age 35. Are African American. Are pregnant with twins or multiple babies (multiple gestation). Take street drugs or smoke while pregnant. Do not gain enough weight while pregnant. Became pregnant shortly after having been pregnant. What are the symptoms of preterm labor? Symptoms of preterm labor include: Cramps similar to those that can happen during a menstrual period. The cramps may happen with diarrhea. Pain in the abdomen or lower back. Regular uterine contractions that may feel like tightening of the abdomen. A feeling of increased pressure in the pelvis. Increased watery or bloody mucus discharge from the vagina. Water breaking (ruptured amniotic sac). Why is it important to recognize signs of preterm labor? It is important to recognize signs of preterm labor because babies who are born prematurely may not be fully developed. This can put them at an increased risk for: Long-term (chronic) heart and lung problems. Difficulty immediately after birth with regulating body systems, including blood sugar, body temperature, heart rate, and breathing rate. Bleeding in the brain. Cerebral palsy. Learning difficulties. Death. These risks are highest for babies who are born before 34 weeks   of pregnancy. How is preterm labor treated? Treatment depends on the length of your pregnancy, your condition, and the health of your baby. It may involve: Having a stitch (suture) placed in your cervix to prevent your cervix from opening too early (cerclage). Taking or being given medicines, such as: Hormone medicines. These may be given early in pregnancy to help support the pregnancy. Medicine to stop contractions. Medicines to help mature the baby's lungs. These may be prescribed if the risk of delivery is high. Medicines to prevent your baby from developing cerebral palsy. If the labor happens before 34 weeks of pregnancy, you may need to stay in the hospital. What should I do if I  think I am in preterm labor? If you think that you are going into preterm labor, call your health care provider right away. How can I prevent preterm labor in future pregnancies? To increase your chance of having a full-term pregnancy: Do not use any tobacco products, such as cigarettes, chewing tobacco, and e-cigarettes. If you need help quitting, ask your health care provider. Do not use street drugs or medicines that have not been prescribed to you during your pregnancy. Talk with your health care provider before taking any herbal supplements, even if you have been taking them regularly. Make sure you gain a healthy amount of weight during your pregnancy. Watch for infection. If you think that you might have an infection, get it checked right away. Make sure to tell your health care provider if you have gone into preterm labor before. This information is not intended to replace advice given to you by your health care provider. Make sure you discuss any questions you have with your health care provider. Document Revised: 04/03/2019 Document Reviewed: 05/02/2016 Elsevier Patient Education  2020 Elsevier Inc.   

## 2023-09-24 NOTE — Progress Notes (Addendum)
LOW-RISK PREGNANCY VISIT Patient name: Rebecca Marsh MRN [id number]  Date of birth: 11/16/2001 Chief Complaint:   Routine Prenatal Visit (Gc/chl today)  History of Present Illness:   Rebecca Marsh is a 22 y.o. G26P0030 female at [redacted]w[redacted]d with an Estimated Date of Delivery: 10/17/23 being seen today for ongoing management of a low-risk pregnancy.   Today she reports  headaches, don't go away w/ 1 regular strength apap. . Denies visual changes, ruq/epigastric pain. Had to walk here from [facility] (boyfriend there being seen).  Contractions: Not present.  .  Movement: Present. denies leaking of fluid.     07/16/2023    2:19 PM 07/09/2022    1:04 PM 05/23/2022    8:03 AM 04/24/2022   10:57 AM 04/09/2022    9:20 AM  Depression screen PHQ 2/9  Decreased Interest 2 0 0 0 2  Down, Depressed, Hopeless 1 0 0 1 1  PHQ - 2 Score 3 0 0 1 3  Altered sleeping 2 0 0 0 3  Tired, decreased energy 3 0 0 0 0  Change in appetite 0 0 0 0 0  Feeling bad or failure about yourself  2 0 0 0 0  Trouble concentrating 1 0 0 0 3  Moving slowly or fidgety/restless 0 0 0 0 0  Suicidal thoughts 0 0 0 0 0  PHQ-9 Score 11 0 0 1 9  Difficult doing work/chores   Not difficult at all  Not difficult at all        07/16/2023    2:20 PM 07/09/2022    1:04 PM 05/23/2022    8:03 AM 04/24/2022   10:57 AM  GAD 7 : Generalized Anxiety Score  Nervous, Anxious, on Edge 1 1 1  0  Control/stop worrying 0 1 0 0  Worry too much - different things 2 1 0 1  Trouble relaxing 2 0 0 0  Restless 2 0 0 1  Easily annoyed or irritable 2 0 0 1  Afraid - awful might happen 0 0 0 0  Total GAD 7 Score 9 3 1 3   Anxiety Difficulty  Somewhat difficult Not difficult at all       Review of Systems:   Pertinent items are noted in HPI Denies abnormal vaginal discharge w/ itching/odor/irritation, headaches, visual changes, shortness of breath, chest pain, abdominal pain, severe nausea/vomiting, or problems with urination or  bowel movements unless otherwise stated above. Pertinent History Reviewed:  Reviewed past medical,surgical, social, obstetrical and family history.  Reviewed problem list, medications and allergies. Physical Assessment:   Vitals:   09/24/23 1449  BP: (!) 143/89  Pulse: (!) 106  Weight: 204 lb 6.4 oz (92.7 kg)  Body mass index is 31.08 kg/m. Left before checking 2nd bp        Physical Examination:   General appearance: Well appearing, and in no distress  Mental status: Alert, oriented to person, place, and time  Skin: Warm & dry  Cardiovascular: Normal heart rate noted  Respiratory: Normal respiratory effort, no distress  Abdomen: Soft, gravid, nontender  Pelvic: Cervical exam performed by Everlena Cooper, SNM Dilation: 1 Effacement (%): 50 Station: -2  Extremities: Edema: None  Fetal Status: Fetal Heart Rate (bpm): 155 Fundal Height: 37 cm Movement: Present Presentation: Vertex  Chaperone:  me    Results for orders placed or performed in visit on 09/24/23 (from the past 24 hour(s))  POC Urinalysis Dipstick OB   Collection Time: 09/24/23  4:33  PM  Result Value Ref Range   Color, UA     Clarity, UA     Glucose, UA Negative Negative   Bilirubin, UA     Ketones, UA negative    Spec Grav, UA     Blood, UA negative    pH, UA     POC,PROTEIN,UA Negative Negative, Trace, Small (1+), Moderate (2+), Large (3+), 4+   Urobilinogen, UA     Nitrite, UA negative    Leukocytes, UA Negative Negative   Appearance     Odor      Assessment & Plan:  1) Low-risk pregnancy G4P0030 at [redacted]w[redacted]d with an Estimated Date of Delivery: 10/17/23   2) Elevated bp, left before able to recheck bp, coming back tomorrow for bp recheck. Reviewed pre-e s/s, reasons to seek care   Meds: No orders of the defined types were placed in this encounter.  Labs/procedures today: GC/CT and SVE  Plan:  Continue routine obstetrical care  Next visit: prefers in person    Reviewed: Preterm labor symptoms and general  obstetric precautions including but not limited to vaginal bleeding, contractions, leaking of fluid and fetal movement were reviewed in detail with the patient.  All questions were answered. Does have home bp cuff. Office bp cuff given: not applicable. Check bp daily, let us know if consistently >140 and/or >90.  Follow-up: Return in about 1 day (around 09/25/2023) for nurse bp check.  Future Appointments  Date Time Provider Department Center  09/25/2023  2:50 PM [facility] NURSE [facility]  10/01/2023  2:50 PM Lazaro Arms, MD [facility]  10/08/2023  2:50 PM Cheral Marker, CNM [facility]  10/15/2023  2:50 PM Cheral Marker, CNM [facility]    Orders Placed This Encounter  Procedures   POC Urinalysis Dipstick OB   Cheral Marker CNM, [facility] 09/24/2023 4:42 PM

## 2023-09-25 ENCOUNTER — Encounter: Payer: Self-pay | Admitting: *Deleted

## 2023-09-25 ENCOUNTER — Telehealth: Payer: MEDICAID

## 2023-09-25 VITALS — BP 123/78 | HR 71

## 2023-09-25 DIAGNOSIS — Z0130 Encounter for examination of blood pressure without abnormal findings: Secondary | ICD-10-CM

## 2023-09-25 DIAGNOSIS — Z3480 Encounter for supervision of other normal pregnancy, unspecified trimester: Secondary | ICD-10-CM

## 2023-09-25 NOTE — Progress Notes (Signed)
   NURSE VISIT- BLOOD PRESSURE CHECK  I connected with EMALIA WITKOP on 09/25/2023 by telephone  and verified that I am speaking with the correct person using two identifiers.   I discussed the limitations of evaluation and management by telemedicine. The patient expressed understanding and agreed to proceed.  Nurse is at the office, and patient is at home.  SUBJECTIVE:  Rebecca Marsh is a 22 y.o. G76P0030 female here for BP check. She is [redacted]w[redacted]d pregnant    HYPERTENSION ROS:  Pregnant/postpartum:  Severe headaches that don't go away with tylenol/other medicines: No  Visual changes (seeing spots/double/blurred vision) No  Severe pain under right breast breast or in center of upper chest Yes  Severe nausea/vomiting Yes  Taking medicines as instructed not applicable   OBJECTIVE:  BP 123/78   Pulse 71   LMP 01/03/2023 (Approximate)   Appearance alert, well appearing, and in no distress.  ASSESSMENT: Pregnancy [redacted]w[redacted]d  blood pressure check  PLAN: Discussed with Joellyn Haff, CNM, [facility]   Recommendations: no changes needed   Follow-up: as scheduled   Malachy Mood  09/25/2023 3:22 PM

## 2023-09-30 LAB — CERVICOVAGINAL ANCILLARY ONLY
Chlamydia: NEGATIVE
Comment: NEGATIVE
Comment: NORMAL
Neisseria Gonorrhea: NEGATIVE

## 2023-10-01 ENCOUNTER — Ambulatory Visit (INDEPENDENT_AMBULATORY_CARE_PROVIDER_SITE_OTHER): Payer: MEDICAID | Admitting: Obstetrics & Gynecology

## 2023-10-01 VITALS — BP 136/91 | HR 127 | Wt 206.0 lb

## 2023-10-01 DIAGNOSIS — Z3480 Encounter for supervision of other normal pregnancy, unspecified trimester: Secondary | ICD-10-CM

## 2023-10-01 DIAGNOSIS — Z3A37 37 weeks gestation of pregnancy: Secondary | ICD-10-CM

## 2023-10-01 DIAGNOSIS — O133 Gestational [pregnancy-induced] hypertension without significant proteinuria, third trimester: Secondary | ICD-10-CM

## 2023-10-01 DIAGNOSIS — Z01818 Encounter for other preprocedural examination: Secondary | ICD-10-CM

## 2023-10-01 NOTE — Progress Notes (Unsigned)
HIGH-RISK PREGNANCY VISIT Patient name: Rebecca Marsh MRN [id number]  Date of birth: 08-May-2001 Chief Complaint:   Routine Prenatal Visit  History of Present Illness:   Rebecca Marsh is a 22 y.o. G71P0030 female at [redacted]w[redacted]d with an Estimated Date of Delivery: 10/17/23 being seen today for ongoing management of a high-risk pregnancy complicated by {High Risk OB:23190}.    Today she reports {pregnancy symptoms:25616::"no complaints"}. Contractions: Irritability. Vag. Bleeding: None.  Movement: Present. {Actions; denies-reports:120008} leaking of fluid.      07/16/2023    2:19 PM 07/09/2022    1:04 PM 05/23/2022    8:03 AM 04/24/2022   10:57 AM 04/09/2022    9:20 AM  Depression screen PHQ 2/9  Decreased Interest 2 0 0 0 2  Down, Depressed, Hopeless 1 0 0 1 1  PHQ - 2 Score 3 0 0 1 3  Altered sleeping 2 0 0 0 3  Tired, decreased energy 3 0 0 0 0  Change in appetite 0 0 0 0 0  Feeling bad or failure about yourself  2 0 0 0 0  Trouble concentrating 1 0 0 0 3  Moving slowly or fidgety/restless 0 0 0 0 0  Suicidal thoughts 0 0 0 0 0  PHQ-9 Score 11 0 0 1 9  Difficult doing work/chores   Not difficult at all  Not difficult at all        07/16/2023    2:20 PM 07/09/2022    1:04 PM 05/23/2022    8:03 AM 04/24/2022   10:57 AM  GAD 7 : Generalized Anxiety Score  Nervous, Anxious, on Edge 1 1 1  0  Control/stop worrying 0 1 0 0  Worry too much - different things 2 1 0 1  Trouble relaxing 2 0 0 0  Restless 2 0 0 1  Easily annoyed or irritable 2 0 0 1  Afraid - awful might happen 0 0 0 0  Total GAD 7 Score 9 3 1 3   Anxiety Difficulty  Somewhat difficult Not difficult at all      Review of Systems:   Pertinent items are noted in HPI Denies abnormal vaginal discharge w/ itching/odor/irritation, headaches, visual changes, shortness of breath, chest pain, abdominal pain, severe nausea/vomiting, or problems with urination or bowel movements unless otherwise stated above. Pertinent  History Reviewed:  Reviewed past medical,surgical, social, obstetrical and family history.  Reviewed problem list, medications and allergies. Physical Assessment:   Vitals:   10/01/23 1520 10/01/23 1542  BP: (!) 144/82 (!) 136/91  Pulse: 96 (!) 127  Weight: 206 lb (93.4 kg)   Body mass index is 31.32 kg/m.           Physical Examination:   General appearance: {:315021}  Mental status: {:313008}  Skin: warm & dry   Extremities:      Cardiovascular: normal heart rate noted  Respiratory: normal respiratory effort, no distress  Abdomen: gravid, soft, non-tender  Pelvic: {Blank single:19197::"Cervical exam performed","Cervical exam deferred"}         Fetal Status:     Movement: Present    Fetal Surveillance Testing today: ***   Chaperone: {Chaperone:19197::"N/A","pt declined","Latisha Cresenzo","Janet Young","Amanda Andrews","Peggy Dones","Angel Neas"}    No results found for this or any previous visit (from the past 24 hour(s)).  Assessment & Plan:  High-risk pregnancy: G4P0030 at [redacted]w[redacted]d with an Estimated Date of Delivery: 10/17/23    No diagnosis found.   1) ***, {stable/unstable:60080}  2) ***, {stable/unstable:60080}  Meds:  No orders of the defined types were placed in this encounter.   Orders: No orders of the defined types were placed in this encounter.    Labs/procedures today: {ob lab/procedures:25214}  Treatment Plan:  ***  Reviewed: {Blank single:19197::"Term","Preterm"} labor symptoms and general obstetric precautions including but not limited to vaginal bleeding, contractions, leaking of fluid and fetal movement were reviewed in detail with the patient.  All questions were answered. {does does not:25387::"Does"} have home bp cuff. Office bp cuff given: {yes/no/default n/a:21102::"not applicable"}. Check bp {weekly daily:25388::"weekly"}, let us know if consistently {pregnant bp:25389::">140 and/or >90"}.  Follow-up: No follow-ups on file.   Future  Appointments  Date Time Provider Department Center  10/08/2023  2:50 PM Cheral Marker, [place] [facility]  10/15/2023  2:50 PM Cheral Marker, CNM [facility]    No orders of the defined types were placed in this encounter.  Lazaro Arms  Attending Physician for the Center for [facility] 10/01/2023 4:01 PM

## 2023-10-01 NOTE — Progress Notes (Unsigned)
   Induction Assessment Scheduling Form: Fax to [facility]:  [phone]  Rebecca Marsh                                                                                   DOB:  2001/05/15                                                            MRN:  [id number]                                                                     Phone #:   [phone]                         Provider:  [facility]  GP:  [id number]                                                            Estimated Date of Delivery: 10/17/23  Dating Criteria: 1st trimester sonogram    Medical Indications for induction:  gHTN Admission Date/Time:  10/02/23 2345 Gestational age on admission:  [redacted]w[redacted]d   Filed Weights   10/01/23 1520  Weight: 206 lb (93.4 kg)   HIV:  Non Reactive (07/25 2841) LKG:MWNUUVO    LTC post   Method of induction(proposed):  cytotec   Scheduling Provider Signature:  Lazaro Arms, MD                                            Today's Date:  10/01/2023

## 2023-10-02 ENCOUNTER — Inpatient Hospital Stay (HOSPITAL_COMMUNITY)
Admission: AD | Admit: 2023-10-02 | Discharge: 2023-10-05 | DRG: 807 | Disposition: A | Discharge status: Home / Self Care | Payer: MEDICAID | Attending: Family Medicine | Admitting: Family Medicine

## 2023-10-02 ENCOUNTER — Other Ambulatory Visit: Payer: Self-pay

## 2023-10-02 ENCOUNTER — Inpatient Hospital Stay (HOSPITAL_COMMUNITY): Payer: MEDICAID

## 2023-10-02 ENCOUNTER — Encounter (HOSPITAL_COMMUNITY): Payer: Self-pay | Admitting: Obstetrics & Gynecology

## 2023-10-02 DIAGNOSIS — Z818 Family history of other mental and behavioral disorders: Secondary | ICD-10-CM | POA: Diagnosis not present

## 2023-10-02 DIAGNOSIS — Z8261 Family history of arthritis: Secondary | ICD-10-CM | POA: Diagnosis not present

## 2023-10-02 DIAGNOSIS — Z880 Allergy status to penicillin: Secondary | ICD-10-CM

## 2023-10-02 DIAGNOSIS — F418 Other specified anxiety disorders: Secondary | ICD-10-CM | POA: Diagnosis present

## 2023-10-02 DIAGNOSIS — Z813 Family history of other psychoactive substance abuse and dependence: Secondary | ICD-10-CM | POA: Diagnosis not present

## 2023-10-02 DIAGNOSIS — O99820 Streptococcus B carrier state complicating pregnancy: Secondary | ICD-10-CM | POA: Diagnosis not present

## 2023-10-02 DIAGNOSIS — Z87891 Personal history of nicotine dependence: Secondary | ICD-10-CM

## 2023-10-02 DIAGNOSIS — Z91018 Allergy to other foods: Secondary | ICD-10-CM

## 2023-10-02 DIAGNOSIS — Z555 Less than a high school diploma: Secondary | ICD-10-CM

## 2023-10-02 DIAGNOSIS — Z3A37 37 weeks gestation of pregnancy: Secondary | ICD-10-CM

## 2023-10-02 DIAGNOSIS — O99344 Other mental disorders complicating childbirth: Secondary | ICD-10-CM | POA: Diagnosis not present

## 2023-10-02 DIAGNOSIS — Z825 Family history of asthma and other chronic lower respiratory diseases: Secondary | ICD-10-CM | POA: Diagnosis not present

## 2023-10-02 DIAGNOSIS — B951 Streptococcus, group B, as the cause of diseases classified elsewhere: Secondary | ICD-10-CM | POA: Diagnosis present

## 2023-10-02 DIAGNOSIS — Z811 Family history of alcohol abuse and dependence: Secondary | ICD-10-CM | POA: Diagnosis not present

## 2023-10-02 DIAGNOSIS — O99824 Streptococcus B carrier state complicating childbirth: Secondary | ICD-10-CM | POA: Diagnosis present

## 2023-10-02 DIAGNOSIS — O133 Gestational [pregnancy-induced] hypertension without significant proteinuria, third trimester: Principal | ICD-10-CM | POA: Diagnosis present

## 2023-10-02 DIAGNOSIS — O134 Gestational [pregnancy-induced] hypertension without significant proteinuria, complicating childbirth: Principal | ICD-10-CM | POA: Diagnosis present

## 2023-10-02 DIAGNOSIS — Z30017 Encounter for initial prescription of implantable subdermal contraceptive: Secondary | ICD-10-CM | POA: Diagnosis not present

## 2023-10-02 DIAGNOSIS — Z01818 Encounter for other preprocedural examination: Secondary | ICD-10-CM

## 2023-10-02 LAB — TYPE AND SCREEN
ABO/RH(D): A POS
Antibody Screen: NEGATIVE

## 2023-10-02 LAB — CBC
HCT: 40.1 % (ref 36.0–46.0)
Hemoglobin: 13.2 g/dL (ref 12.0–15.0)
MCH: 26.3 pg (ref 26.0–34.0)
MCHC: 32.9 g/dL (ref 30.0–36.0)
MCV: 80 fL (ref 80.0–100.0)
Platelets: 315 10*3/uL (ref 150–400)
RBC: 5.01 MIL/uL (ref 3.87–5.11)
RDW: 13.3 % (ref 11.5–15.5)
WBC: 13.9 10*3/uL — ABNORMAL HIGH (ref 4.0–10.5)
nRBC: 0 % (ref 0.0–0.2)

## 2023-10-02 LAB — COMPREHENSIVE METABOLIC PANEL
ALT: 12 U/L (ref 0–44)
AST: 18 U/L (ref 15–41)
Albumin: 2.8 g/dL — ABNORMAL LOW (ref 3.5–5.0)
Alkaline Phosphatase: 179 U/L — ABNORMAL HIGH (ref 38–126)
Anion gap: 10 (ref 5–15)
BUN: 9 mg/dL (ref 6–20)
CO2: 17 mmol/L — ABNORMAL LOW (ref 22–32)
Calcium: 9.2 mg/dL (ref 8.9–10.3)
Chloride: 107 mmol/L (ref 98–111)
Creatinine, Ser: 0.74 mg/dL (ref 0.44–1.00)
GFR, Estimated: >60 mL/min (ref >60)
Glucose, Bld: 76 mg/dL (ref 70–99)
Potassium: 3.9 mmol/L (ref 3.5–5.1)
Sodium: 134 mmol/L — ABNORMAL LOW (ref 135–145)
Total Bilirubin: 0.6 mg/dL (ref 0.3–1.2)
Total Protein: 6.9 g/dL (ref 6.5–8.1)

## 2023-10-02 LAB — PROTEIN / CREATININE RATIO, URINE
Creatinine, Urine: 114 mg/dL
Protein Creatinine Ratio: 0.1 mg/mg{creat} (ref 0.00–0.15)
Total Protein, Urine: 11 mg/dL

## 2023-10-02 MED ORDER — TERBUTALINE SULFATE 1 MG/ML IJ SOLN: 0.2500 mg | Freq: Once | INTRAMUSCULAR | Status: DC | PRN

## 2023-10-02 MED ORDER — ONDANSETRON HCL 4 MG/2ML IJ SOLN
4.0000 mg | Freq: Four times a day (QID) | INTRAMUSCULAR | Status: DC | PRN
  Administered 2023-10-02: 4 mg via INTRAVENOUS
  Filled 2023-10-02: qty 2

## 2023-10-02 MED ORDER — MISOPROSTOL 50MCG HALF TABLET
50.0000 ug | ORAL_TABLET | Freq: Once | ORAL | Status: AC
  Administered 2023-10-02: 50 ug via ORAL
  Filled 2023-10-02: qty 1

## 2023-10-02 MED ORDER — MISOPROSTOL 25 MCG QUARTER TABLET
25.0000 ug | ORAL_TABLET | Freq: Once | ORAL | Status: AC
  Administered 2023-10-02: 25 ug via VAGINAL
  Filled 2023-10-02: qty 1

## 2023-10-02 MED ORDER — OXYCODONE-ACETAMINOPHEN 5-325 MG PO TABS: 2.0000 | ORAL_TABLET | ORAL | Status: DC | PRN

## 2023-10-02 MED ORDER — LIDOCAINE HCL (PF) 1 % IJ SOLN: 30.0000 mL | INTRAMUSCULAR | Status: DC | PRN

## 2023-10-02 MED ORDER — MISOPROSTOL 50MCG HALF TABLET
50.0000 ug | ORAL_TABLET | ORAL | Status: DC
  Administered 2023-10-02 – 2023-10-03 (×2): 50 ug via BUCCAL
  Filled 2023-10-02 (×2): qty 1

## 2023-10-02 MED ORDER — VANCOMYCIN HCL 10 G IV SOLR
1750.0000 mg | Freq: Three times a day (TID) | INTRAVENOUS | Status: DC
  Administered 2023-10-02 – 2023-10-03 (×3): 1750 mg via INTRAVENOUS
  Filled 2023-10-02: qty 1750
  Filled 2023-10-02 (×4): qty 17.5
  Filled 2023-10-02: qty 1750

## 2023-10-02 MED ORDER — ACETAMINOPHEN 325 MG PO TABS: 650.0000 mg | ORAL_TABLET | ORAL | Status: DC | PRN

## 2023-10-02 MED ORDER — FENTANYL CITRATE (PF) 100 MCG/2ML IJ SOLN
50.0000 ug | INTRAMUSCULAR | Status: DC | PRN
  Administered 2023-10-02: 50 ug via INTRAVENOUS
  Administered 2023-10-03 (×2): 100 ug via INTRAVENOUS
  Filled 2023-10-02 (×3): qty 2

## 2023-10-02 MED ORDER — LACTATED RINGERS IV SOLN: 500.0000 mL | INTRAVENOUS | Status: DC | PRN

## 2023-10-02 MED ORDER — OXYCODONE-ACETAMINOPHEN 5-325 MG PO TABS: 1.0000 | ORAL_TABLET | ORAL | Status: DC | PRN

## 2023-10-02 MED ORDER — LACTATED RINGERS IV SOLN: INTRAVENOUS | Status: DC

## 2023-10-02 MED ORDER — OXYTOCIN-SODIUM CHLORIDE 30-0.9 UT/500ML-% IV SOLN
2.5000 [IU]/h | INTRAVENOUS | Status: DC
  Administered 2023-10-03: 2.5 [IU]/h via INTRAVENOUS

## 2023-10-02 MED ORDER — SOD CITRATE-CITRIC ACID 500-334 MG/5ML PO SOLN: 30.0000 mL | ORAL | Status: DC | PRN

## 2023-10-02 MED ORDER — OXYTOCIN-SODIUM CHLORIDE 30-0.9 UT/500ML-% IV SOLN
1.0000 m[IU]/min | INTRAVENOUS | Status: DC
  Administered 2023-10-03 (×2): 2 m[IU]/min via INTRAVENOUS
  Administered 2023-10-03: 6 m[IU]/min via INTRAVENOUS
  Administered 2023-10-03: 4 m[IU]/min via INTRAVENOUS
  Administered 2023-10-03: 8 m[IU]/min via INTRAVENOUS
  Filled 2023-10-02: qty 500

## 2023-10-02 MED ORDER — OXYTOCIN BOLUS FROM INFUSION
333.0000 mL | Freq: Once | INTRAVENOUS | Status: AC
  Administered 2023-10-03: 333 mL via INTRAVENOUS
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
## 2023-10-02 NOTE — Progress Notes (Signed)
LABOR PROGRESS NOTE  Bedside RN, [name], provided update that patient requested FB to be removed. Pt is ambulating now. Her strip remains cat I. Plan for recheck ~1930.   BP (!) 91/41   Pulse 97   Temp 98.3 F (36.8 C) (Axillary)   Resp 19   Ht 5\' 8"  (1.727 m)   Wt 93 kg   LMP 01/03/2023 (Approximate)   SpO2 99%   BMI 31.19 kg/m   Rebecca Aland, MD OB Fellow, Faculty Practice [facility], Center for [facility]

## 2023-10-02 NOTE — Progress Notes (Signed)
Patient ID: Rebecca Marsh, female   DOB: 07-Mar-2001, 22 y.o.   MRN: [id number]  S/p a dual dose of cytotec and a removal of cervical foley due to discomfort; feels better now  BPs 91/41, 125/81, 131/79 FHR 130-140s, +accels, no decels Ctx irreg Cx 1+/50/vtx -3  IUP@37 .6wks gHTN Cx unfavorable  Will plan cytotec buccal q 4h until more favorable for Pitocin  Arabella Merles [facility] 10/02/2023 7:32 PM

## 2023-10-02 NOTE — Progress Notes (Signed)
ANTIBIOTIC CONSULT NOTE  Pharmacy Consult for Vancomycin Indication: GBS Prophylaxis   Allergies  Allergen Reactions   Penicillins Hives, Shortness Of Breath and Rash    Has patient had a PCN reaction causing immediate rash, facial/tongue/throat swelling, SOB or lightheadedness with hypotension: Yes Has patient had a PCN reaction causing severe rash involving mucus membranes or skin necrosis: Yes Has patient had a PCN reaction that required hospitalization: Yes Has patient had a PCN reaction occurring within the last 10 years: Yes If all of the above answers are "NO", then may proceed with Cephalosporin use.    Almond Oil Hives    Anything with almonds    Vital Signs: Temp: 97.6 F (36.4 C) (10/09 1403) Temp Source: Oral (10/09 1403) BP: 117/80 (10/09 1425) Pulse Rate: 73 (10/09 1425)  Labs: No results for input(s): "WBC", "HGB", "PLT", "LABCREA", "CREATININE", "CRCLEARANCE" in the last 72 hours. No results for input(s): "VANCOTROUGH", "VANCOPEAK", "VANCORANDOM" in the last 72 hours.   Microbiology: No results found for this or any previous visit (from the past 720 hour(s)).  Assessment: 22 y.o. female G4P0030 at [redacted]w[redacted]d with elevated BP. PCN allergy with SOB, hives, rash. No susceptibilities available for clinda.  Goal of Therapy:  Vancomycin Trough 15-20 mg/L  Plan:  Start Vancomycin 1750 mg IV every 8 hrs.  Check Scr with next labs if vancomycin continued beyond 48 hours. Will check vancomycin trough level prior to 4th or 5th dose if continuing beyond 48 hours.  Margaretmary Bayley 10/02/2023,3:08 PM

## 2023-10-02 NOTE — Progress Notes (Signed)
Pt calls out complaining of red itching hands, spoke with pharmacy who says to cut vancomycin rate in half.  Rate now set to 14ml/hr.

## 2023-10-03 ENCOUNTER — Inpatient Hospital Stay (HOSPITAL_COMMUNITY): Payer: MEDICAID | Admitting: Anesthesiology

## 2023-10-03 ENCOUNTER — Encounter (HOSPITAL_COMMUNITY): Payer: Self-pay | Admitting: Obstetrics & Gynecology

## 2023-10-03 DIAGNOSIS — Z3A37 37 weeks gestation of pregnancy: Secondary | ICD-10-CM

## 2023-10-03 DIAGNOSIS — O134 Gestational [pregnancy-induced] hypertension without significant proteinuria, complicating childbirth: Secondary | ICD-10-CM

## 2023-10-03 DIAGNOSIS — O99344 Other mental disorders complicating childbirth: Secondary | ICD-10-CM

## 2023-10-03 DIAGNOSIS — O99820 Streptococcus B carrier state complicating pregnancy: Secondary | ICD-10-CM

## 2023-10-03 LAB — CBC
HCT: 37.9 % (ref 36.0–46.0)
Hemoglobin: 12.5 g/dL (ref 12.0–15.0)
MCH: 26.7 pg (ref 26.0–34.0)
MCHC: 33 g/dL (ref 30.0–36.0)
MCV: 81 fL (ref 80.0–100.0)
Platelets: 286 10*3/uL (ref 150–400)
RBC: 4.68 MIL/uL (ref 3.87–5.11)
RDW: 13.6 % (ref 11.5–15.5)
WBC: 14 10*3/uL — ABNORMAL HIGH (ref 4.0–10.5)
nRBC: 0 % (ref 0.0–0.2)

## 2023-10-03 LAB — RPR: RPR Ser Ql: NONREACTIVE

## 2023-10-03 MED ORDER — EPHEDRINE 5 MG/ML INJ: 10.0000 mg | INTRAVENOUS | Status: DC | PRN

## 2023-10-03 MED ORDER — IBUPROFEN 600 MG PO TABS
600.0000 mg | ORAL_TABLET | Freq: Four times a day (QID) | ORAL | Status: DC
  Administered 2023-10-04 – 2023-10-05 (×7): 600 mg via ORAL
  Filled 2023-10-03 (×8): qty 1

## 2023-10-03 MED ORDER — MEASLES, MUMPS & RUBELLA VAC IJ SOLR: 0.5000 mL | Freq: Once | INTRAMUSCULAR | Status: DC

## 2023-10-03 MED ORDER — WITCH HAZEL-GLYCERIN EX PADS: 1.0000 | MEDICATED_PAD | CUTANEOUS | Status: DC | PRN

## 2023-10-03 MED ORDER — PHENYLEPHRINE 80 MCG/ML (10ML) SYRINGE FOR IV PUSH (FOR BLOOD PRESSURE SUPPORT)
80.0000 ug | PREFILLED_SYRINGE | INTRAVENOUS | Status: DC | PRN
  Filled 2023-10-03: qty 10

## 2023-10-03 MED ORDER — LACTATED RINGERS IV SOLN: 500.0000 mL | Freq: Once | INTRAVENOUS | Status: DC

## 2023-10-03 MED ORDER — SENNOSIDES-DOCUSATE SODIUM 8.6-50 MG PO TABS
2.0000 | ORAL_TABLET | ORAL | Status: DC
  Administered 2023-10-04: 2 via ORAL
  Filled 2023-10-03 (×2): qty 2

## 2023-10-03 MED ORDER — LIDOCAINE-EPINEPHRINE (PF) 1.5 %-1:200000 IJ SOLN
INTRAMUSCULAR | Status: DC | PRN
Start: 2023-10-03 — End: 2023-10-03
  Administered 2023-10-03: 5 mL via EPIDURAL

## 2023-10-03 MED ORDER — DIPHENHYDRAMINE HCL 25 MG PO CAPS: 25.0000 mg | ORAL_CAPSULE | Freq: Four times a day (QID) | ORAL | Status: DC | PRN

## 2023-10-03 MED ORDER — PHENYLEPHRINE 80 MCG/ML (10ML) SYRINGE FOR IV PUSH (FOR BLOOD PRESSURE SUPPORT): 80.0000 ug | PREFILLED_SYRINGE | INTRAVENOUS | Status: DC | PRN

## 2023-10-03 MED ORDER — PRENATAL MULTIVITAMIN CH
1.0000 | ORAL_TABLET | Freq: Every day | ORAL | Status: DC
  Administered 2023-10-04 – 2023-10-05 (×2): 1 via ORAL
  Filled 2023-10-03 (×2): qty 1

## 2023-10-03 MED ORDER — SIMETHICONE 80 MG PO CHEW: 80.0000 mg | CHEWABLE_TABLET | ORAL | Status: DC | PRN

## 2023-10-03 MED ORDER — ONDANSETRON HCL 4 MG PO TABS: 4.0000 mg | ORAL_TABLET | ORAL | Status: DC | PRN

## 2023-10-03 MED ORDER — DIBUCAINE (PERIANAL) 1 % EX OINT: 1.0000 | TOPICAL_OINTMENT | CUTANEOUS | Status: DC | PRN

## 2023-10-03 MED ORDER — COCONUT OIL OIL: 1.0000 | TOPICAL_OIL | Status: DC | PRN

## 2023-10-03 MED ORDER — ONDANSETRON HCL 4 MG/2ML IJ SOLN: 4.0000 mg | INTRAMUSCULAR | Status: DC | PRN

## 2023-10-03 MED ORDER — EPHEDRINE 5 MG/ML INJ
10.0000 mg | INTRAVENOUS | Status: DC | PRN
  Filled 2023-10-03: qty 5

## 2023-10-03 MED ORDER — ZOLPIDEM TARTRATE 5 MG PO TABS: 5.0000 mg | ORAL_TABLET | Freq: Every evening | ORAL | Status: DC | PRN

## 2023-10-03 MED ORDER — FENTANYL-BUPIVACAINE-NACL 0.5-0.125-0.9 MG/250ML-% EP SOLN
12.0000 mL/h | EPIDURAL | Status: DC | PRN
  Administered 2023-10-03: 12 mL/h via EPIDURAL
  Filled 2023-10-03: qty 250

## 2023-10-03 MED ORDER — ACETAMINOPHEN 325 MG PO TABS
650.0000 mg | ORAL_TABLET | ORAL | Status: DC | PRN
  Administered 2023-10-03: 650 mg via ORAL
  Filled 2023-10-03: qty 2

## 2023-10-03 MED ORDER — BENZOCAINE-MENTHOL 20-0.5 % EX AERO
1.0000 | INHALATION_SPRAY | CUTANEOUS | Status: DC | PRN
  Administered 2023-10-03: 1 via TOPICAL
  Filled 2023-10-03: qty 56

## 2023-10-03 MED ORDER — DIPHENHYDRAMINE HCL 50 MG/ML IJ SOLN: 12.5000 mg | INTRAMUSCULAR | Status: DC | PRN

## 2023-10-03 MED ORDER — TETANUS-DIPHTH-ACELL PERTUSSIS 5-2.5-18.5 LF-MCG/0.5 IM SUSY: 0.5000 mL | PREFILLED_SYRINGE | Freq: Once | INTRAMUSCULAR | Status: DC

## 2023-10-03 NOTE — Anesthesia Procedure Notes (Signed)
Epidural Patient location during procedure: OB Start time: 10/03/2023 9:58 AM End time: 10/03/2023 10:03 AM  Staffing Anesthesiologist: Atilano Median, DO Performed: anesthesiologist   Preanesthetic Checklist Completed: patient identified, IV checked, site marked, risks and benefits discussed, surgical consent, monitors and equipment checked, pre-op evaluation and timeout performed  Epidural Patient position: sitting Prep: ChloraPrep Patient monitoring: heart rate, continuous pulse ox and blood pressure Approach: midline Location: L3-L4 Injection technique: LOR saline  Needle:  Needle type: Tuohy  Needle gauge: 17 G Needle length: 9 cm Needle insertion depth: 6 cm Catheter type: closed end flexible Catheter size: 20 Guage Catheter at skin depth: 12 cm Test dose: negative and 1.5% lidocaine with Epi 1:200 K  Assessment Events: blood not aspirated, no cerebrospinal fluid, injection not painful, no injection resistance and no paresthesia  Additional Notes Patient identified. Risks/Benefits/Options discussed with patient including but not limited to bleeding, infection, nerve damage, paralysis, failed block, incomplete pain control, headache, blood pressure changes, nausea, vomiting, reactions to medications, itching and postpartum back pain. Confirmed with bedside nurse the patient's most recent platelet count. Confirmed with patient that they are not currently taking any anticoagulation, have any bleeding history or any family history of bleeding disorders. Patient expressed understanding and wished to proceed. All questions were answered. Sterile technique was used throughout the entire procedure. Please see nursing notes for vital signs. Test dose was given through epidural catheter and negative prior to continuing to dose epidural or start infusion. Warning signs of high block given to the patient including shortness of breath, tingling/numbness in hands, complete motor  block, or any concerning symptoms with instructions to call for help. Patient was given instructions on fall risk and not to get out of bed. All questions and concerns addressed with instructions to call with any issues or inadequate analgesia.    Reason for block:procedure for pain

## 2023-10-03 NOTE — Anesthesia Preprocedure Evaluation (Signed)
Anesthesia Evaluation  Patient identified by MRN, date of birth, ID band Patient awake    Reviewed: Allergy & Precautions, NPO status , Patient's Chart, lab work & pertinent test results  Airway Mallampati: II  TM Distance: >3 FB Neck ROM: Full    Dental no notable dental hx.    Pulmonary asthma , former smoker   Pulmonary exam normal        Cardiovascular hypertension,  Rhythm:Regular Rate:Normal     Neuro/Psych   Anxiety Depression Bipolar Disorder      GI/Hepatic negative GI ROS, Neg liver ROS,,,  Endo/Other  negative endocrine ROS    Renal/GU negative Renal ROS  negative genitourinary   Musculoskeletal   Abdominal Normal abdominal exam  (+)   Peds  Hematology Lab Results      Component                Value               Date                      WBC                      14.0 (H)            10/03/2023                HGB                      12.5                10/03/2023                HCT                      37.9                10/03/2023                MCV                      81.0                10/03/2023                PLT                      286                 10/03/2023              Anesthesia Other Findings   Reproductive/Obstetrics (+) Pregnancy                              Anesthesia Physical Anesthesia Plan  ASA: 2  Anesthesia Plan: Epidural   Post-op Pain Management:    Induction:   PONV Risk Score and Plan: 2 and Treatment may vary due to age or medical condition  Airway Management Planned: Natural Airway  Additional Equipment: None  Intra-op Plan:   Post-operative Plan:   Informed Consent: I have reviewed the patients History and Physical, chart, labs and discussed the procedure including the risks, benefits and alternatives for the proposed anesthesia with the patient or authorized representative who has indicated his/her understanding and acceptance.      Dental advisory given  Plan Discussed with:   Anesthesia Plan Comments:          Anesthesia Quick Evaluation

## 2023-10-03 NOTE — Progress Notes (Signed)
Patient ID: Rebecca Marsh, female   DOB: 2001-09-22, 22 y.o.   MRN: [id number]  Pitocin started at 0500; pt sleeping s/p Fentanyl  BPs 126/83, 127/75 FHR 120-130s, +accels, no decels Ctx not tracing currently, but did have UI earlier Cx 3/60/vtx -2 @ start of Pit  IUP@38 .0wks gHTN IOL process  Continue uptitrating Pit to achieve active labor; consider AROM later today; anticipate vag delivery  Arabella Merles CNM 10/03/2023 7:29 AM

## 2023-10-03 NOTE — Discharge Summary (Signed)
Postpartum Discharge Summary    Patient Name: Rebecca Marsh DOB: February 21, 2001 MRN: [id number]  Date of admission: 10/02/2023 Delivery date:10/03/2023 Delivering provider: [name] Date of discharge: 10/05/2023  Admitting diagnosis: Gestational hypertension, third trimester [O13.3] Intrauterine pregnancy: [redacted]w[redacted]d     Secondary diagnosis:  Principal Problem:   Gestational hypertension, third trimester Active Problems:   Group B Streptococcus urinary tract infection affecting pregnancy in second trimester   Depression with anxiety   NSVD (normal spontaneous vaginal delivery)  Additional problems: none    Discharge diagnosis: Term Pregnancy Delivered                                              Post partum procedures: none Augmentation: AROM, Pitocin, and Cytotec Complications: None  Hospital course: Induction of Labor With Vaginal Delivery   22 y.o. yo [phone] at [redacted]w[redacted]d was admitted to the hospital 10/02/2023 for induction of labor.  Indication for induction: Gestational hypertension.  Patient had a labor course complicated by period of recurrent late decels corrected with amnioinfusion.  Membrane Rupture Time/Date: 9:32 AM,10/03/2023  Delivery Method:Vaginal, Spontaneous Operative Delivery:N/A Episiotomy: None Lacerations:  None Details of delivery can be found in separate delivery note.  Patient had a postpartum course complicated by nothing. Patient is discharged home 10/05/23.  Newborn Data: Birth date:10/03/2023 Birth time:1:34 PM Gender:Female Living status:Living Apgars:9 ,9  Weight:2510 g  Magnesium Sulfate received: No BMZ received: No Rhophylac:No MMR:Yes T-DaP:Given prenatally Flu: N/A RSV Vaccine received: No Transfusion:No  Immunizations received: Immunization History  Administered Date(s) Administered   Tdap 05/23/2022, 08/13/2023    Physical exam  Vitals:   10/04/23 0600 10/04/23 1342 10/04/23 2100 10/05/23 0518  BP: 119/65 132/66 120/69  116/68  Pulse: 61 71 69 62  Resp: 18 18 19 18   Temp: 97.6 F (36.4 C) 97.9 F (36.6 C) 97.8 F (36.6 C) 97.9 F (36.6 C)  TempSrc:  Oral Oral Oral  SpO2: 100% 98% 99% 99%  Weight:      Height:       General: alert, cooperative, and no distress Lochia: appropriate Uterine Fundus: firm DVT Evaluation: No evidence of DVT seen on physical exam. Labs: Lab Results  Component Value Date   WBC 13.8 (H) 10/04/2023   HGB 11.2 (L) 10/04/2023   HCT 33.2 (L) 10/04/2023   MCV 82.2 10/04/2023   PLT 237 10/04/2023      Latest Ref Rng & Units 10/02/2023    2:30 PM  CMP  Glucose 70 - 99 mg/dL 76   BUN 6 - 20 mg/dL 9   Creatinine 8.29 - 5.62 mg/dL 1.30   Sodium 865 - 784 mmol/L 134   Potassium 3.5 - 5.1 mmol/L 3.9   Chloride 98 - 111 mmol/L 107   CO2 22 - 32 mmol/L 17   Calcium 8.9 - 10.3 mg/dL 9.2   Total Protein 6.5 - 8.1 g/dL 6.9   Total Bilirubin 0.3 - 1.2 mg/dL 0.6   Alkaline Phos 38 - 126 U/L 179   AST 15 - 41 U/L 18   ALT 0 - 44 U/L 12    Edinburgh Score:    10/04/2023   12:28 PM  Edinburgh Postnatal Depression Scale Screening Tool  I have been able to laugh and see the funny side of things. 0  I have looked forward with enjoyment to things. 0  I have  blamed myself unnecessarily when things went wrong. 1  I have been anxious or worried for no good reason. 2  I have felt scared or panicky for no good reason. 0  Things have been getting on top of me. 0  I have been so unhappy that I have had difficulty sleeping. 0  I have felt sad or miserable. 0  I have been so unhappy that I have been crying. 0  The thought of harming myself has occurred to me. 0  Edinburgh Postnatal Depression Scale Total 3   Edinburgh Postnatal Depression Scale Total: 3   After visit meds:  Allergies as of 10/05/2023       Reactions   Penicillins Hives, Shortness Of Breath, Rash   Almond Oil Hives   Anything with almonds        Medication List     TAKE these medications     ibuprofen 600 MG tablet Commonly known as: ADVIL Take 1 tablet (600 mg total) by mouth every 6 (six) hours.   prenatal vitamin w/FE, FA 27-1 MG Tabs tablet Take 1 tablet by mouth daily at 12 noon.         Discharge home in stable condition Infant Feeding: Bottle Infant Disposition:home with mother Discharge instruction: per After Visit Summary and Postpartum booklet. Activity: Advance as tolerated. Pelvic rest for 6 weeks.  Diet: routine diet Future Appointments: Future Appointments  Date Time Provider Department Center  10/17/2023 11:30 AM Jacklyn Shell, CNM [facility]  11/13/2023  9:30 AM Grace Bushy Merlene Laughter, CNM [facility]   Follow up Visit:  Follow-up Information     [facility] for [facility] Follow up in 6 week(s).   Specialty: Obstetrics and Gynecology Contact information: [address] [place] [address] [phone]              Message sent to [facility] 10/10   Please schedule this patient for a Virtual postpartum visit in 6 weeks with the following provider: Any provider. Additional Postpartum F/U:Postpartum Depression checkup and BP check 2 weeks   High risk pregnancy complicated by: HTN Delivery mode:  Vaginal, Spontaneous Anticipated Birth Control:  Nexplanon    10/05/2023 Reva Bores, MD

## 2023-10-03 NOTE — Lactation Note (Signed)
This note was copied from a baby's chart. Lactation Consultation Note  Patient Name: Girl Amiyrah Lamere UEAVW'U Date: 10/03/2023 Age:22 hours  Mom chooses to formula feed.   Maternal Data    Feeding Nipple Type: Slow - flow  LATCH Score                    Lactation Tools Discussed/Used    Interventions    Discharge    Consult Status Consult Status: Complete    Paydon Carll G 10/03/2023, 7:28 PM

## 2023-10-03 NOTE — Progress Notes (Signed)
Patient ID: Rebecca Marsh, female   DOB: [birth year], 22 y.o.   MRN: [id number]   Strip note:  S/p dual dose cytotec and then 2 buccal doses BPs 123/74, 132/61 FHR 120s, +accels, no decels Toco not picking up Cx 2-3/60/vtx -2 w last cytotec at 0004  Plan to start Pitocin in a little bit Anticipate vag delivery  Rebecca Marsh CNM 10/03/2023 3:54 AM

## 2023-10-03 NOTE — Progress Notes (Signed)
Patient stating IV site is sore and would like the IV site changed.  No signs of infiltration or phlebitis.  IV fluids stopped.  Will change sites.

## 2023-10-03 NOTE — Progress Notes (Signed)
Labor Progress Note Rebecca Marsh is a 22 y.o. G4P0030 at [redacted]w[redacted]d presented for IOL for gHTN S: Coping well, desires epidural. Requesting AROM.   O:  BP 109/62   Pulse (!) 59   Temp 98 F (36.7 C) (Oral)   Resp 16   Ht 5\' 8"  (1.727 m)   Wt 93 kg   LMP 01/03/2023 (Approximate)   SpO2 99%   BMI 31.19 kg/m  EFM: 135/moderate variability/accels present/no decels  CVE: Dilation: 4 Effacement (%): 60 Cervical Position: Middle Station: -2, -1 Presentation: Vertex Exam by:: Bryna Colander RN   A&P: 22 y.o. G4P0030 [redacted]w[redacted]d admitted for IOL #Labor: Progressing well. AROM clear.  #Pain: Getting epidural #FWB: Cat I #GBS  unknown, severe PCN allergy; Vancomycin  #gHTN - diagnosis in third trimester, not currently on medications. CBC, CMP and PC ratio wnl.   [name], MD 10:31 AM

## 2023-10-04 LAB — CBC
HCT: 33.2 % — ABNORMAL LOW (ref 36.0–46.0)
Hemoglobin: 11.2 g/dL — ABNORMAL LOW (ref 12.0–15.0)
MCH: 27.7 pg (ref 26.0–34.0)
MCHC: 33.7 g/dL (ref 30.0–36.0)
MCV: 82.2 fL (ref 80.0–100.0)
Platelets: 237 10*3/uL (ref 150–400)
RBC: 4.04 MIL/uL (ref 3.87–5.11)
RDW: 13.5 % (ref 11.5–15.5)
WBC: 13.8 10*3/uL — ABNORMAL HIGH (ref 4.0–10.5)
nRBC: 0 % (ref 0.0–0.2)

## 2023-10-04 MED ORDER — LIDOCAINE HCL 1 % IJ SOLN: 0.0000 mL | Freq: Once | INTRAMUSCULAR | Status: DC | PRN

## 2023-10-04 MED ORDER — ETONOGESTREL 68 MG ~~LOC~~ IMPL: 68.0000 mg | DRUG_IMPLANT | Freq: Once | SUBCUTANEOUS | Status: DC

## 2023-10-04 NOTE — Lactation Note (Signed)
This note was copied from a baby's chart. Lactation Consultation Note  Patient Name: Rebecca Marsh [id number] Date: 10/04/2023 Age:22 hours Reason for consult: Follow-up assessment;Early term 37-38.6wks;Primapara;Breastfeeding assistance  Visited with P1 parent of ETI per RN request. Parent wanted initially to formula feed but now is interested in pumping and feeding breast milk for a few days. Parent has a hand pump, but no pump at home. Insurance does not qualify for [employer] pump. Parent may be interested in setting up a DEBP shortly. Parent will call out for further lactation assistance.  Maternal Data Has patient been taught Hand Expression?: Yes Does the patient have breastfeeding experience prior to this delivery?: No  Feeding Mother's Current Feeding Choice: Breast Milk and Formula Nipple Type: Slow - flow  Interventions Interventions: Breast feeding basics reviewed;Hand pump;Education;Guidelines for Milk Supply and Pumping Schedule Handout  Discharge Pump: Manual  Consult Status Consult Status: Follow-up Date: 10/05/23 Follow-up type: In-patient    Antionette Char 10/04/2023, 2:57 PM

## 2023-10-04 NOTE — Anesthesia Postprocedure Evaluation (Signed)
Anesthesia Post Note  Patient: Rebecca Marsh  Procedure(s) Performed: AN AD HOC LABOR EPIDURAL     Patient location during evaluation: Mother Baby Anesthesia Type: Epidural Level of consciousness: awake and alert Pain management: pain level controlled Vital Signs Assessment: post-procedure vital signs reviewed and stable Respiratory status: spontaneous breathing, nonlabored ventilation and respiratory function stable Cardiovascular status: stable Postop Assessment: no headache, no backache, epidural receding, no apparent nausea or vomiting, patient able to bend at knees, able to ambulate and adequate PO intake Anesthetic complications: no   No notable events documented.  Last Vitals:  Vitals:   10/03/23 2037 10/04/23 0600  BP: 128/79 119/65  Pulse: 60 61  Resp: 18 18  Temp: 36.8 C 36.4 C  SpO2: 100% 100%    Last Pain:  Vitals:   10/04/23 0645  TempSrc:   PainSc: 0-No pain   Pain Goal: Patients Stated Pain Goal: 0 (10/03/23 0945)                 Laban Emperor

## 2023-10-04 NOTE — Progress Notes (Signed)
Patient ID: Rebecca Marsh, female   DOB: [birth year], 22 y.o.   MRN: [id number]  POSTPARTUM PROGRESS NOTE  Postpartum Day 1  Subjective:  Rebecca Marsh is a 22 y.o. B2W4132 s/p SVD at [redacted]w[redacted]d.  No acute events overnight.  Pt denies problems with ambulating, voiding or po intake.  She denies nausea or vomiting.  Pain is well controlled.  She has had flatus. She has had bowel movement.  Lochia Small.   Objective: Blood pressure 132/66, pulse 71, temperature 97.9 F (36.6 C), temperature source Oral, resp. rate 18, height 5\' 8"  (1.727 m), weight 205 lb 1.6 oz (93 kg), last menstrual period 01/03/2023, SpO2 98%, unknown if currently breastfeeding.  Physical Exam:  General: alert, cooperative and no distress Chest: no respiratory distress Heart:regular rate, distal pulses intact Abdomen: soft, nontender,  Uterine Fundus: firm, appropriately tender DVT Evaluation: No calf swelling or tenderness Extremities: no edema Skin: warm, dry  Recent Labs    10/03/23 0601 10/04/23 0352  HGB 12.5 11.2*  HCT 37.9 33.2*   Assessment/Plan: Rebecca Marsh is a 22 y.o. G4W1027 s/p SVD at [redacted]w[redacted]d   PPD#1 - Doing well Contraception: Nexplanon tomorrow Feeding: Bottle Dispo: Plan for discharge tomorrow.   LOS: 2 days   Osborne Oman, CNM 10/04/2023, 2:59 PM

## 2023-10-04 NOTE — Clinical Social Work Maternal (Signed)
CLINICAL SOCIAL WORK MATERNAL/CHILD NOTE   Patient Details  Name: MECHEL HAGGARD MRN: [id number] Date of Birth: 08/29/2001   Date:  08/24/2023   Clinical Social Worker Initiating Note:  Willaim Rayas Willo Yoon      Date/Time: Initiated:  10/04/23/1504      Child's Name:  Rebecca Marsh 2023-04-12    Biological Parents:  Mother, Father Christiann Hagerty 08/20/2001, Sammuel Cooper. 11/12/2001)    Need for Interpreter:  None    Reason for Referral:  Behavioral Health Concerns    Address:  [address] [place] [address]    Phone number:  [phone] (home)      Additional phone number:    Household Members/Support Persons (HM/SP):   Household Member/Support Person 1, Household Member/Support Person 2, Household Member/Support Person 3     HM/SP Name Relationship DOB or Age  HM/SP -1 Oletha Blend Sr Father in Social worker unknown  HM/SP -2 Cherryl Moore mother in Social worker unknown  HM/SP -3 Sammuel Cooper FOB 11/12/2001  HM/SP -4     HM/SP -5     HM/SP -6     HM/SP -7     HM/SP -8         Natural Supports (not living in the home):       Professional Supports: None    Employment: Unemployed    Type of Work:      Education:  9 to 11 years    Homebound arranged: No   Financial Resources:  [employer]    Other Resources:  Sales executive  , [employer]    Cultural/Religious Considerations Which May Impact Care:     Strengths:  Ability to meet basic needs  , Home prepared for child  , Pediatrician chosen    Psychotropic Medications:          Pediatrician:    Sara Lee   Pediatrician List:    [place]   [facility]  [facility]       Pediatrician Fax Number:     Risk Factors/Current Problems:  None    Cognitive State:  Able to Concentrate  , Alert      Mood/Affect:  Calm  , Interested  , Comfortable      CSW Assessment: CSW received a consult for MOB hx of anxiety, depression and  Bipolar 1 disorder.. CSW met with MOB to complete assessment and offer support. CSW entered the room and observed MOB resting in bed, and FOB holding the infant at bedside. CSW introduced self, CSW role and reason for visit, MOB was agreeable to visit and allowed FOB to remain in the room during the assessment. CSW inquired about how MOB was feeling, MOB reported great. CSW confirmed MOB demographic information, MOB reported she is currently residing at [address] [place], [place] and her phone is off so she is used phone number [phone].    CSW inquired abut MOB MH hx, MOB reported he was diagnosed over 5 years ago and was taking Buspar until she found out she was pregnant, MOB reported she plans to restart the medication now that she has delivered.  MOB reported the medication has been beneficial for her mood. MOB reported a stable mood throughout pregnancy and no concerns with her MH. CSW provided education regarding the baby blues period vs. perinatal mood disorders, discussed treatment and gave resources for mental health follow up if concerns arise.  CSW recommends  self-evaluation during the postpartum time period using the New Mom Checklist from Postpartum Progress and encouraged MOB to contact a medical professional if symptoms are noted at any time.    CSW provided review of Sudden Infant Death Syndrome (SIDS) precautions.  MOB reported she has all necessary items for the infant including a bassinet and car seat.  CSW identifies no further need for intervention and no barriers to discharge at this time.   CSW Plan/Description:  No Further Intervention Required/No Barriers to Discharge, Sudden Infant Death Syndrome (SIDS) Education, Perinatal Mood and Anxiety Disorder (PMADs) Education, Other Information/Referral to [employer], LCSW 2023-08-19, 3:08 PM

## 2023-10-05 DIAGNOSIS — Z30017 Encounter for initial prescription of implantable subdermal contraceptive: Secondary | ICD-10-CM

## 2023-10-05 MED ORDER — IBUPROFEN 600 MG PO TABS: 600.0000 mg | ORAL_TABLET | Freq: Four times a day (QID) | ORAL | 0 refills | Status: DC

## 2023-10-05 MED ORDER — LIDOCAINE HCL 1 % IJ SOLN
0.0000 mL | Freq: Once | INTRAMUSCULAR | Status: AC | PRN
  Administered 2023-10-05: 20 mL via INTRADERMAL
  Filled 2023-10-05: qty 20

## 2023-10-05 MED ORDER — ETONOGESTREL 68 MG ~~LOC~~ IMPL
68.0000 mg | DRUG_IMPLANT | Freq: Once | SUBCUTANEOUS | Status: AC
  Administered 2023-10-05: 68 mg via SUBCUTANEOUS
  Filled 2023-10-05: qty 1

## 2023-10-05 NOTE — Procedures (Addendum)
Immediate Post-Partum Nexplanon Insertion Procedure Note  Patient was identified.Risks and benefits of Nexplanon reviewed.  Informed consent was signed, signed copy in chart.   The insertion site was identified 8-10 cm (3-4 inches) from the medial epicondyle of the humerus and 3-5 cm (1.25-2 inches) posterior to (below) the sulcus (groove) between the biceps and triceps muscles of the patient's L arm and marked. The site was prepped and draped in the usual sterile fashion. Pt was prepped with alcohol swab and then injected with 3 cc of 1% lidocaine.  The site was prepped with betadine. Nexplanon removed form packaging,  Device confirmed in needle, then inserted full length of needle and withdrawn per handbook instructions. Provider and patient verified presence of the implant in the patient's arm by palpation. Pt insertion site was covered with steristrips/adhesive bandage and pressure bandage. There was minimal blood loss. Patient tolerated procedure well.  Patient was given post procedure instructions and Nexplanon user card with expiration date. Condoms were recommended for STI prevention. Patient was asked to keep the pressure dressing on for 24 hours to minimize bruising and keep the adhesive bandage on for 3-5 days. The patient verbalized understanding of the plan of care and agrees.   See MAR for medication details  Charma Igo, MD 10/05/23 12:54 PM  GME ATTESTATION:  Evaluation and management procedures were performed by the [facility] Medicine Resident under my supervision. I was immediately available and gloved for direct supervision, assistance and direction throughout this encounter.  I also confirm that I have verified the information documented in the resident's note, and that I have also personally reperformed the pertinent components of the physical exam and all of the medical decision making activities.  I have also made any necessary editorial changes.  [name], MD OB  Fellow, Faculty Practice [facility], Center for [facility] 10/05/2023 5:43 PM

## 2023-10-07 LAB — SURGICAL PATHOLOGY

## 2023-10-08 ENCOUNTER — Encounter: Payer: MEDICAID | Admitting: Women's Health

## 2023-10-15 ENCOUNTER — Encounter: Payer: MEDICAID | Admitting: Women's Health

## 2023-10-17 ENCOUNTER — Ambulatory Visit: Payer: MEDICAID | Admitting: Advanced Practice Midwife

## 2023-10-21 ENCOUNTER — Ambulatory Visit: Payer: MEDICAID | Admitting: Advanced Practice Midwife

## 2023-10-26 ENCOUNTER — Telehealth (HOSPITAL_COMMUNITY): Payer: Self-pay

## 2023-10-26 NOTE — Telephone Encounter (Signed)
10/26/2023  Name: Rebecca Marsh MRN: [id number] DOB: 12-Aug-2001  Reason for Call:  Transition of Care Hospital Discharge Call  Contact Status: Patient Contact Status: Unable to contact Phone number is not in service. Language assistant needed: Interpreter Mode: Interpreter Not Needed        Follow-Up Questions:    Rebecca Marsh Postnatal Depression Scale:  In the Past 7 Days:    PHQ2-9 Depression Scale:     Discharge Follow-up:    Post-discharge interventions: NA  Signature  Signe Colt

## 2023-11-13 ENCOUNTER — Ambulatory Visit: Payer: MEDICAID | Admitting: Women's Health

## 2024-01-29 ENCOUNTER — Other Ambulatory Visit: Payer: Self-pay

## 2024-01-29 ENCOUNTER — Emergency Department (HOSPITAL_COMMUNITY)
Admission: EM | Admit: 2024-01-29 | Discharge: 2024-01-29 | Discharge status: Against Medical Advice | Payer: MEDICAID | Attending: Emergency Medicine | Admitting: Emergency Medicine

## 2024-01-29 ENCOUNTER — Encounter (HOSPITAL_COMMUNITY): Payer: Self-pay | Admitting: *Deleted

## 2024-01-29 DIAGNOSIS — R109 Unspecified abdominal pain: Secondary | ICD-10-CM | POA: Insufficient documentation

## 2024-01-29 DIAGNOSIS — Z5321 Procedure and treatment not carried out due to patient leaving prior to being seen by health care provider: Secondary | ICD-10-CM | POA: Diagnosis not present

## 2024-01-29 DIAGNOSIS — R112 Nausea with vomiting, unspecified: Secondary | ICD-10-CM | POA: Diagnosis not present

## 2024-01-29 NOTE — ED Triage Notes (Signed)
 Pt with c/o abd pain for awhile, worse last night.  +N/V-denies diarrhea  denies any fever or chills.

## 2024-01-29 NOTE — ED Notes (Signed)
Lab has called pt multiple times and no response from waiting room

## 2024-02-07 ENCOUNTER — Inpatient Hospital Stay (HOSPITAL_COMMUNITY)
Admission: EM | Admit: 2024-02-07 | Discharge: 2024-02-08 | DRG: 392 | Disposition: A | Discharge status: Home / Self Care | Payer: MEDICAID | Attending: Internal Medicine | Admitting: Internal Medicine

## 2024-02-07 ENCOUNTER — Encounter (HOSPITAL_COMMUNITY): Payer: Self-pay | Admitting: Emergency Medicine

## 2024-02-07 ENCOUNTER — Other Ambulatory Visit: Payer: Self-pay

## 2024-02-07 ENCOUNTER — Emergency Department (HOSPITAL_COMMUNITY): Payer: MEDICAID

## 2024-02-07 DIAGNOSIS — Z880 Allergy status to penicillin: Secondary | ICD-10-CM | POA: Diagnosis not present

## 2024-02-07 DIAGNOSIS — R7401 Elevation of levels of liver transaminase levels: Secondary | ICD-10-CM

## 2024-02-07 DIAGNOSIS — E669 Obesity, unspecified: Secondary | ICD-10-CM | POA: Diagnosis present

## 2024-02-07 DIAGNOSIS — F319 Bipolar disorder, unspecified: Secondary | ICD-10-CM | POA: Diagnosis present

## 2024-02-07 DIAGNOSIS — L409 Psoriasis, unspecified: Secondary | ICD-10-CM | POA: Diagnosis present

## 2024-02-07 DIAGNOSIS — F4310 Post-traumatic stress disorder, unspecified: Secondary | ICD-10-CM | POA: Diagnosis present

## 2024-02-07 DIAGNOSIS — Z8261 Family history of arthritis: Secondary | ICD-10-CM

## 2024-02-07 DIAGNOSIS — I880 Nonspecific mesenteric lymphadenitis: Secondary | ICD-10-CM | POA: Diagnosis present

## 2024-02-07 DIAGNOSIS — J45909 Unspecified asthma, uncomplicated: Secondary | ICD-10-CM | POA: Diagnosis present

## 2024-02-07 DIAGNOSIS — R1011 Right upper quadrant pain: Secondary | ICD-10-CM | POA: Diagnosis not present

## 2024-02-07 DIAGNOSIS — Z818 Family history of other mental and behavioral disorders: Secondary | ICD-10-CM

## 2024-02-07 DIAGNOSIS — R7989 Other specified abnormal findings of blood chemistry: Secondary | ICD-10-CM

## 2024-02-07 DIAGNOSIS — Z6830 Body mass index (BMI) 30.0-30.9, adult: Secondary | ICD-10-CM

## 2024-02-07 DIAGNOSIS — E7800 Pure hypercholesterolemia, unspecified: Secondary | ICD-10-CM | POA: Diagnosis present

## 2024-02-07 DIAGNOSIS — K760 Fatty (change of) liver, not elsewhere classified: Secondary | ICD-10-CM | POA: Diagnosis present

## 2024-02-07 DIAGNOSIS — K529 Noninfective gastroenteritis and colitis, unspecified: Secondary | ICD-10-CM | POA: Diagnosis present

## 2024-02-07 DIAGNOSIS — Z825 Family history of asthma and other chronic lower respiratory diseases: Secondary | ICD-10-CM

## 2024-02-07 DIAGNOSIS — R109 Unspecified abdominal pain: Secondary | ICD-10-CM | POA: Diagnosis present

## 2024-02-07 DIAGNOSIS — R112 Nausea with vomiting, unspecified: Secondary | ICD-10-CM | POA: Diagnosis present

## 2024-02-07 DIAGNOSIS — Z87891 Personal history of nicotine dependence: Secondary | ICD-10-CM

## 2024-02-07 LAB — COMPREHENSIVE METABOLIC PANEL
ALT: 449 U/L — ABNORMAL HIGH (ref 0–44)
AST: 695 U/L — ABNORMAL HIGH (ref 15–41)
Albumin: 4.5 g/dL (ref 3.5–5.0)
Alkaline Phosphatase: 105 U/L (ref 38–126)
Anion gap: 12 (ref 5–15)
BUN: 14 mg/dL (ref 6–20)
CO2: 22 mmol/L (ref 22–32)
Calcium: 9.6 mg/dL (ref 8.9–10.3)
Chloride: 105 mmol/L (ref 98–111)
Creatinine, Ser: 0.69 mg/dL (ref 0.44–1.00)
GFR, Estimated: >60 mL/min (ref >60)
Glucose, Bld: 98 mg/dL (ref 70–99)
Potassium: 3.6 mmol/L (ref 3.5–5.1)
Sodium: 139 mmol/L (ref 135–145)
Total Bilirubin: 3.3 mg/dL — ABNORMAL HIGH (ref 0.0–1.2)
Total Protein: 8.2 g/dL — ABNORMAL HIGH (ref 6.5–8.1)

## 2024-02-07 LAB — URINALYSIS, ROUTINE W REFLEX MICROSCOPIC
Glucose, UA: NEGATIVE mg/dL
Hgb urine dipstick: NEGATIVE
Ketones, ur: 5 mg/dL — AB
Leukocytes,Ua: NEGATIVE
Nitrite: NEGATIVE
Protein, ur: 100 mg/dL — AB
Specific Gravity, Urine: 1.04 — ABNORMAL HIGH (ref 1.005–1.030)
pH: 5 (ref 5.0–8.0)

## 2024-02-07 LAB — CBC
HCT: 43.2 % (ref 36.0–46.0)
Hemoglobin: 13.4 g/dL (ref 12.0–15.0)
MCH: 25.7 pg — ABNORMAL LOW (ref 26.0–34.0)
MCHC: 31 g/dL (ref 30.0–36.0)
MCV: 82.9 fL (ref 80.0–100.0)
Platelets: 302 10*3/uL (ref 150–400)
RBC: 5.21 MIL/uL — ABNORMAL HIGH (ref 3.87–5.11)
RDW: 12.9 % (ref 11.5–15.5)
WBC: 9.2 10*3/uL (ref 4.0–10.5)
nRBC: 0 % (ref 0.0–0.2)

## 2024-02-07 LAB — HCG, SERUM, QUALITATIVE: Preg, Serum: NEGATIVE

## 2024-02-07 LAB — LIPASE, BLOOD: Lipase: 42 U/L (ref 11–51)

## 2024-02-07 MED ORDER — SODIUM CHLORIDE 0.9 % IV SOLN: Freq: Once | INTRAVENOUS | Status: AC

## 2024-02-07 MED ORDER — CEFTRIAXONE SODIUM 1 G IJ SOLR
1.0000 g | Freq: Once | INTRAMUSCULAR | Status: AC
  Administered 2024-02-07: 1 g via INTRAVENOUS
  Filled 2024-02-07: qty 10

## 2024-02-07 MED ORDER — ONDANSETRON HCL 4 MG/2ML IJ SOLN
4.0000 mg | Freq: Once | INTRAMUSCULAR | Status: AC
  Administered 2024-02-07: 4 mg via INTRAVENOUS
  Filled 2024-02-07: qty 2

## 2024-02-07 MED ORDER — MORPHINE SULFATE (PF) 2 MG/ML IV SOLN
2.0000 mg | INTRAVENOUS | Status: DC | PRN
  Administered 2024-02-08 (×2): 2 mg via INTRAVENOUS
  Filled 2024-02-07 (×2): qty 1

## 2024-02-07 MED ORDER — METRONIDAZOLE 500 MG/100ML IV SOLN
500.0000 mg | Freq: Once | INTRAVENOUS | Status: AC
  Administered 2024-02-07: 500 mg via INTRAVENOUS
  Filled 2024-02-07: qty 100

## 2024-02-07 MED ORDER — IOHEXOL 300 MG/ML  SOLN
100.0000 mL | Freq: Once | INTRAMUSCULAR | Status: AC | PRN
  Administered 2024-02-07: 100 mL via INTRAVENOUS
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
## 2024-02-07 NOTE — ED Provider Notes (Signed)
 [place] EMERGENCY DEPARTMENT AT [facility] Provider Note   CSN: [id number] Arrival date & time: 02/07/24  1844     History  Chief Complaint  Patient presents with   Abdominal Pain    Rebecca Marsh is a 23 y.o. female with history of asthma, bipolar 1 disorder, psoriasis.  Patient presents to ED for evaluation of right upper quadrant abdominal pain radiating to the back.  Reports that she has had this pain intermittently over the last 1 year ever since being pregnant with her daughter.  States that she was advised at 1 point that it might be her gallbladder but never followed up on this.  Reports over the last 2 days she has had intermittent colicky right upper quadrant abdominal pain.  She reports nausea, vomiting associated with this.  Denies any diarrhea, fevers, dysuria, flank pain, chest pain or shortness of breath.  Denies any alleviating or aggravating factors.   Abdominal Pain Associated symptoms: nausea and vomiting        Home Medications Prior to Admission medications   Medication Sig Start Date End Date Taking? Authorizing Provider  ibuprofen (ADVIL) 600 MG tablet Take 1 tablet (600 mg total) by mouth every 6 (six) hours. 10/05/23   Reva Bores, MD  prenatal vitamin w/FE, FA (PRENATAL 1 + 1) 27-1 MG TABS tablet Take 1 tablet by mouth daily at 12 noon. 02/05/23   Adline Potter, NP      Allergies    Penicillins and Almond oil    Review of Systems   Review of Systems  Gastrointestinal:  Positive for abdominal pain, nausea and vomiting.  All other systems reviewed and are negative.   Physical Exam Updated Vital Signs BP 98/71 (BP Location: Right Arm)   Pulse 77   Temp 98.2 F (36.8 C) (Oral)   Resp (!) 21   Ht 5\' 8"  (1.727 m)   Wt 92.1 kg   LMP 01/21/2024 (Approximate) Comment: recent birth [birth year]  SpO2 100%   BMI 30.87 kg/m  Physical Exam Vitals and nursing note reviewed.  Constitutional:      General: She is not in acute  distress.    Appearance: She is well-developed.  HENT:     Head: Normocephalic and atraumatic.  Eyes:     Conjunctiva/sclera: Conjunctivae normal.  Cardiovascular:     Rate and Rhythm: Normal rate and regular rhythm.     Heart sounds: No murmur heard. Pulmonary:     Effort: Pulmonary effort is normal. No respiratory distress.     Breath sounds: Normal breath sounds.  Abdominal:     Palpations: Abdomen is soft.     Tenderness: There is abdominal tenderness.     Comments: RUQ tenderness, positive Murphy sign  Musculoskeletal:        General: No swelling.     Cervical back: Neck supple.  Skin:    General: Skin is warm and dry.     Capillary Refill: Capillary refill takes less than 2 seconds.  Neurological:     Mental Status: She is alert and oriented to person, place, and time.  Psychiatric:        Mood and Affect: Mood normal.     ED Results / Procedures / Treatments   Labs (all labs ordered are listed, but only abnormal results are displayed) Labs Reviewed  COMPREHENSIVE METABOLIC PANEL - Abnormal; Notable for the following components:      Result Value   Total Protein 8.2 (*)  AST 695 (*)    ALT 449 (*)    Total Bilirubin 3.3 (*)    All other components within normal limits  CBC - Abnormal; Notable for the following components:   RBC 5.21 (*)    MCH 25.7 (*)    All other components within normal limits  URINALYSIS, ROUTINE W REFLEX MICROSCOPIC - Abnormal; Notable for the following components:   Color, Urine AMBER (*)    APPearance HAZY (*)    Specific Gravity, Urine 1.040 (*)    Bilirubin Urine MODERATE (*)    Ketones, ur 5 (*)    Protein, ur 100 (*)    Bacteria, UA MANY (*)    All other components within normal limits  LIPASE, BLOOD  HCG, SERUM, QUALITATIVE  HEPATITIS PANEL, ACUTE    EKG EKG Interpretation Date/Time:  Friday February 07 2024 19:09:44 EST Ventricular Rate:  79 PR Interval:  160 QRS Duration:  78 QT Interval:  388 QTC  Calculation: 444 R Axis:   53  Text Interpretation: Normal sinus rhythm Normal ECG Confirmed by Vivi Barrack [phone]) on 02/07/2024 10:50:02 PM  Radiology CT ABDOMEN PELVIS W CONTRAST Result Date: 02/07/2024 CLINICAL DATA:  Right upper quadrant abdominal pain, nausea, vomiting, epigastric pain EXAM: CT ABDOMEN AND PELVIS WITH CONTRAST TECHNIQUE: Multidetector CT imaging of the abdomen and pelvis was performed using the standard protocol following bolus administration of intravenous contrast. RADIATION DOSE REDUCTION: This exam was performed according to the departmental dose-optimization program which includes automated exposure control, adjustment of the mA and/or kV according to patient size and/or use of iterative reconstruction technique. CONTRAST:  OMNIPAQUE IOHEXOL 300 MG/ML  SOLN COMPARISON:  None Available. FINDINGS: Lower chest: No acute abnormality. Hepatobiliary: No focal liver abnormality is seen. No gallstones, gallbladder wall thickening, or biliary dilatation. Pancreas: Unremarkable Spleen: Unremarkable Adrenals/Urinary Tract: Adrenal glands are unremarkable. Kidneys are normal, without renal calculi, focal lesion, or hydronephrosis. Bladder is unremarkable. Stomach/Bowel: Stomach is within normal limits. Appendix absent. No evidence of bowel wall thickening, distention, or inflammatory changes. Vascular/Lymphatic: There is shotty mesenteric at which is nonspecific, but may be infectious or inflammatory in nature as can be seen with gastroenteritis or mesenteric adenitis. No frankly pathologic adenopathy within the abdomen and pelvis. The abdominal vasculature is unremarkable. Reproductive: Uterus and bilateral adnexa are unremarkable. Other: No abdominal wall hernia or abnormality. No abdominopelvic ascites. Musculoskeletal: No acute or significant osseous findings. IMPRESSION: 1. Shotty mesenteric at which is nonspecific, but may be infectious or inflammatory in nature as can be seen  with gastroenteritis or mesenteric adenitis. 2. Status post appendectomy Electronically Signed   By: Helyn Numbers M.D.   On: 02/07/2024 22:57    Procedures Procedures    Medications Ordered in ED Medications  cefTRIAXone (ROCEPHIN) 1 g in sodium chloride 0.9 % 100 mL IVPB (1 g Intravenous New Bag/Given 02/07/24 2335)  metroNIDAZOLE (FLAGYL) IVPB 500 mg (500 mg Intravenous New Bag/Given 02/07/24 2333)  0.9 %  sodium chloride infusion (has no administration in time range)  iohexol (OMNIPAQUE) 300 MG/ML solution 100 mL (100 mLs Intravenous Contrast Given 02/07/24 2221)  ondansetron (ZOFRAN) injection 4 mg (4 mg Intravenous Given 02/07/24 2329)    ED Course/ Medical Decision Making/ A&P Clinical Course as of 02/07/24 2343  Fri Feb 07, 2024  2250 Choledocholithiasis vs cholecystitis [CG]  2313 mrcp [CG]    Clinical Course User Index [CG] Al Decant, PA-C   Medical Decision Making Amount and/or Complexity of Data Reviewed Labs: ordered. Radiology:  ordered.  Risk Prescription drug management. Decision regarding hospitalization.   23 year old female presents for evaluation.  Please see HPI for further details.  On exam patient is afebrile and nontachycardic.  Lung sounds are clear bilaterally, not hypoxic.  Abdomen has tenderness in the right upper quadrant with a positive Murphy sign.  Neuroexam at baseline.  Suspect cholecystitis.  No ultrasound available at this time as will collect CT scan.  Will also collect labs to include CBC, CMP, lipase, urinalysis.  CBC without leukocytosis or anemia.  Metabolic panel with elevated AST 695, ALT 449, total bilirubin 3.3.  Alk phos 105.  Urinalysis shows bilirubin, ketones, protein.  Lipase WNL.  CT scan abdomen pelvis is unremarkable, unspecific.  Sure shotty mesenteric at which is nonspecific but may be infectious or inflammatory in nature.  Suspect choledocholithiasis due to elevated LFTs.  Spoke with Dr. Elnoria Howard of GI who advises  admission for MRCP.  Will start patient on antibiotics, provide Zofran and maintenance fluid.  Patient admitted to Dr. Thomes Dinning.  Final Clinical Impression(s) / ED Diagnoses Final diagnoses:  Right upper quadrant abdominal pain  Elevated LFTs    Rx / DC Orders ED Discharge Orders     None         Al Decant, PA-C 02/07/24 2343    Loetta Rough, MD 02/10/24 [phone]

## 2024-02-07 NOTE — ED Triage Notes (Signed)
Pt c/o N/V, epigastric pain through to her back intermittently x 2 days.

## 2024-02-08 ENCOUNTER — Inpatient Hospital Stay (HOSPITAL_COMMUNITY): Payer: MEDICAID

## 2024-02-08 ENCOUNTER — Encounter (HOSPITAL_COMMUNITY): Payer: Self-pay | Admitting: Internal Medicine

## 2024-02-08 DIAGNOSIS — R7401 Elevation of levels of liver transaminase levels: Secondary | ICD-10-CM | POA: Insufficient documentation

## 2024-02-08 DIAGNOSIS — E669 Obesity, unspecified: Secondary | ICD-10-CM | POA: Insufficient documentation

## 2024-02-08 LAB — HEPATITIS PANEL, ACUTE
HCV Ab: NONREACTIVE
Hep A IgM: NONREACTIVE
Hep B C IgM: NONREACTIVE
Hepatitis B Surface Ag: NONREACTIVE

## 2024-02-08 LAB — MAGNESIUM: Magnesium: 2.2 mg/dL (ref 1.7–2.4)

## 2024-02-08 LAB — CBC
HCT: 38.7 % (ref 36.0–46.0)
Hemoglobin: 12 g/dL (ref 12.0–15.0)
MCH: 25.8 pg — ABNORMAL LOW (ref 26.0–34.0)
MCHC: 31 g/dL (ref 30.0–36.0)
MCV: 83 fL (ref 80.0–100.0)
Platelets: 281 10*3/uL (ref 150–400)
RBC: 4.66 MIL/uL (ref 3.87–5.11)
RDW: 13.2 % (ref 11.5–15.5)
WBC: 7.4 10*3/uL (ref 4.0–10.5)
nRBC: 0 % (ref 0.0–0.2)

## 2024-02-08 LAB — COMPREHENSIVE METABOLIC PANEL
ALT: 418 U/L — ABNORMAL HIGH (ref 0–44)
AST: 325 U/L — ABNORMAL HIGH (ref 15–41)
Albumin: 4.2 g/dL (ref 3.5–5.0)
Alkaline Phosphatase: 113 U/L (ref 38–126)
Anion gap: 12 (ref 5–15)
BUN: 11 mg/dL (ref 6–20)
CO2: 22 mmol/L (ref 22–32)
Calcium: 9.2 mg/dL (ref 8.9–10.3)
Chloride: 103 mmol/L (ref 98–111)
Creatinine, Ser: 0.8 mg/dL (ref 0.44–1.00)
GFR, Estimated: >60 mL/min (ref >60)
Glucose, Bld: 90 mg/dL (ref 70–99)
Potassium: 3.6 mmol/L (ref 3.5–5.1)
Sodium: 137 mmol/L (ref 135–145)
Total Bilirubin: 1 mg/dL (ref 0.0–1.2)
Total Protein: 7.6 g/dL (ref 6.5–8.1)

## 2024-02-08 LAB — PHOSPHORUS: Phosphorus: 2.9 mg/dL (ref 2.5–4.6)

## 2024-02-08 LAB — PROTIME-INR
INR: 1 (ref 0.8–1.2)
Prothrombin Time: 13.6 s (ref 11.4–15.2)

## 2024-02-08 MED ORDER — ACETAMINOPHEN 650 MG RE SUPP: 650.0000 mg | Freq: Four times a day (QID) | RECTAL | Status: DC | PRN

## 2024-02-08 MED ORDER — ACETAMINOPHEN 325 MG PO TABS: 650.0000 mg | ORAL_TABLET | Freq: Four times a day (QID) | ORAL | Status: DC | PRN

## 2024-02-08 MED ORDER — ONDANSETRON HCL 4 MG/2ML IJ SOLN: 4.0000 mg | Freq: Four times a day (QID) | INTRAMUSCULAR | Status: DC | PRN

## 2024-02-08 MED ORDER — GADOBUTROL 1 MMOL/ML IV SOLN
9.2000 mL | Freq: Once | INTRAVENOUS | Status: AC | PRN
  Administered 2024-02-08: 9.2 mL via INTRAVENOUS

## 2024-02-08 MED ORDER — LACTATED RINGERS IV SOLN: INTRAVENOUS | Status: DC

## 2024-02-08 MED ORDER — METRONIDAZOLE 500 MG PO TABS
500.0000 mg | ORAL_TABLET | Freq: Two times a day (BID) | ORAL | 0 refills | Status: AC
Start: 2024-02-08 — End: 2024-02-11

## 2024-02-08 MED ORDER — ONDANSETRON HCL 4 MG PO TABS: 4.0000 mg | ORAL_TABLET | Freq: Four times a day (QID) | ORAL | Status: DC | PRN

## 2024-02-08 MED ORDER — LACTATED RINGERS IV SOLN: INTRAVENOUS | Status: AC

## 2024-02-08 MED ORDER — ENOXAPARIN SODIUM 40 MG/0.4ML IJ SOSY: 40.0000 mg | PREFILLED_SYRINGE | INTRAMUSCULAR | Status: DC

## 2024-02-08 MED ORDER — CIPROFLOXACIN HCL 500 MG PO TABS: 500.0000 mg | ORAL_TABLET | Freq: Two times a day (BID) | ORAL | 0 refills | Status: AC

## 2024-02-08 MED ORDER — SODIUM CHLORIDE 0.9 % IV SOLN
2.0000 g | INTRAVENOUS | Status: DC
  Administered 2024-02-08: 2 g via INTRAVENOUS
  Filled 2024-02-08: qty 20

## 2024-02-08 MED ORDER — HEPARIN SODIUM (PORCINE) 5000 UNIT/ML IJ SOLN
5000.0000 [IU] | Freq: Three times a day (TID) | INTRAMUSCULAR | Status: DC
  Administered 2024-02-08: 5000 [IU] via SUBCUTANEOUS
  Filled 2024-02-08: qty 1

## 2024-02-08 MED ORDER — METRONIDAZOLE 500 MG/100ML IV SOLN
500.0000 mg | Freq: Two times a day (BID) | INTRAVENOUS | Status: DC
  Administered 2024-02-08: 500 mg via INTRAVENOUS
  Filled 2024-02-08: qty 100

## 2024-02-08 NOTE — Discharge Summary (Signed)
 Physician Discharge Summary   Patient: Rebecca Marsh MRN: [id number] DOB: 12/27/2000  Admit date:     02/07/2024  Discharge date: 02/08/24  Discharge Physician: MDALA-GAUSI, Gwenette Greet   PCP: Pcp, No   Recommendations at discharge:   Follow-up with gastroenterology Follow-up with surgery.  Discharge Diagnoses: Principal Problem:   Abdominal pain Active Problems:   Nausea & vomiting   Transaminitis   Obesity (BMI 30-39.9)  Resolved Problems:   * No resolved hospital problems. *  Hospital Course: 23 year old woman with PMH of asthma, bipolar disorder who presented to the ED with right upper quadrant pain.  Patient reports she has had this pain intermittently for about a year.  She reported diarrhea prior to presentation. ED workup revealed transaminitis, hyperbilirubinemia.  CT Abdomen and pelvis showed findings suggestive of gastroenteritis or mesenteric adenitis.MRI of the abdomen MRCP showed no acute findings to account for the patient's symptoms.  No gallstones or cholecystitis or choledocholithiasis.  Noted to have mild hepatic steatosis.  The patient was started on IV ceftriaxone, Flagyl.  Gastroenterology was consulted.  The morning after presentation, the patient reported that her pain had resolved.  She had had no further episodes of diarrhea.  Transaminases had trended down and hyperbilirubinemia had resolved.  US liver Doppler was done and showed patent hepatic vasculature with normal directional flow as well as findings suggestive of hepatic steatosis.  Plan of care was discussed with gastroenterology and the patient is being discharged home on oral antibiotics and recommendation to follow-up with her PCP and have her liver function rechecked in the coming week.  Ambulatory referrals were placed to gastroenterology and surgery due to suspicion for her having passed a stone and her possible need for cholecystectomy.       Consultants: Gastroenterology Procedures  performed: n/a  Disposition: Home Diet recommendation:  Discharge Diet Orders (From admission, onward)     Start     Ordered   02/08/24 0000  Diet - low sodium heart healthy        02/08/24 1409           Regular diet DISCHARGE MEDICATION: Allergies as of 02/08/2024       Reactions   Penicillins Hives, Shortness Of Breath, Rash   Almond Oil Hives   Anything with almonds        Medication List     STOP taking these medications    acetaminophen 500 MG tablet Commonly known as: TYLENOL       TAKE these medications    ciprofloxacin 500 MG tablet Commonly known as: Cipro Take 1 tablet (500 mg total) by mouth 2 (two) times daily for 3 days. Start taking on: February 09, 2024   metroNIDAZOLE 500 MG tablet Commonly known as: Flagyl Take 1 tablet (500 mg total) by mouth 2 (two) times daily for 3 days.        Discharge Exam: Filed Weights   02/07/24 1907  Weight: 92.1 kg   Physical Exam on Day of Discharge   General: Alert, cheerful, oriented X3  Oral cavity: moist mucous membranes  Neck: supple  Chest: clear to auscultation. No crackles, no wheezes  CVS: S1,S2 RRR. No murmurs  Abd: No distention, soft, non-tender. No masses palpable  Extr: No edema    Condition at discharge: good  The results of significant diagnostics from this hospitalization (including imaging, microbiology, ancillary and laboratory) are listed below for reference.   Imaging Studies: MR ABDOMEN MRCP W WO CONTAST Result Date: 02/08/2024 CLINICAL  DATA:  23 year old female history of right upper quadrant abdominal pain. EXAM: MRI ABDOMEN WITHOUT AND WITH CONTRAST (INCLUDING MRCP) TECHNIQUE: Multiplanar multisequence MR imaging of the abdomen was performed both before and after the administration of intravenous contrast. Heavily T2-weighted images of the biliary and pancreatic ducts were obtained, and three-dimensional MRCP images were rendered by post processing. CONTRAST:  9.90mL  GADAVIST GADOBUTROL 1 MMOL/ML IV SOLN COMPARISON:  No prior abdominal MRI. CT of the abdomen and pelvis 02/07/2024. FINDINGS: Comment: Portions of today's examination are limited by substantial patient respiratory motion. Lower chest: Unremarkable. Hepatobiliary: Mild diffuse loss of signal intensity throughout the hepatic parenchyma on out of phase dual echo images, indicative of a background of hepatic steatosis. No definite suspicious cystic or solid hepatic lesions. No intra or extrahepatic biliary ductal dilatation. No filling defects are noted within the gallbladder to suggest gallstones. Gallbladder is nearly decompressed, but otherwise unremarkable in appearance. No filling defects are noted within the common bile duct on MRCP images to suggest choledocholithiasis. Common bile duct is normal in caliber measuring 4 mm in the porta hepatis. Pancreas: No pancreatic mass. No pancreatic ductal dilatation. No pancreatic or peripancreatic fluid collections or inflammatory changes. Spleen:  Unremarkable. Adrenals/Urinary Tract: Bilateral kidneys and bilateral adrenal glands are normal in appearance. No hydroureteronephrosis in the visualized portions of the abdomen. Stomach/Bowel: Visualized portions are unremarkable. Vascular/Lymphatic: No aneurysm identified in the visualized abdominal vasculature. No lymphadenopathy noted in the abdomen. Other: No significant volume of ascites noted in the visualized portions of the peritoneal cavity. Musculoskeletal: No aggressive appearing osseous lesions are noted in the visualized portions of the skeleton. IMPRESSION: 1. No acute findings are noted in the abdomen to account for the patient's symptoms. Specifically, no gallstones or findings to suggest acute cholecystitis. No choledocholithiasis or findings of biliary tract obstruction. 2. Mild hepatic steatosis. Electronically Signed   By: Trudie Reed M.D.   On: 02/08/2024 08:52   MR 3D Recon At Scanner Result Date:  02/08/2024 CLINICAL DATA:  23 year old female history of right upper quadrant abdominal pain. EXAM: MRI ABDOMEN WITHOUT AND WITH CONTRAST (INCLUDING MRCP) TECHNIQUE: Multiplanar multisequence MR imaging of the abdomen was performed both before and after the administration of intravenous contrast. Heavily T2-weighted images of the biliary and pancreatic ducts were obtained, and three-dimensional MRCP images were rendered by post processing. CONTRAST:  9.33mL GADAVIST GADOBUTROL 1 MMOL/ML IV SOLN COMPARISON:  No prior abdominal MRI. CT of the abdomen and pelvis 02/07/2024. FINDINGS: Comment: Portions of today's examination are limited by substantial patient respiratory motion. Lower chest: Unremarkable. Hepatobiliary: Mild diffuse loss of signal intensity throughout the hepatic parenchyma on out of phase dual echo images, indicative of a background of hepatic steatosis. No definite suspicious cystic or solid hepatic lesions. No intra or extrahepatic biliary ductal dilatation. No filling defects are noted within the gallbladder to suggest gallstones. Gallbladder is nearly decompressed, but otherwise unremarkable in appearance. No filling defects are noted within the common bile duct on MRCP images to suggest choledocholithiasis. Common bile duct is normal in caliber measuring 4 mm in the porta hepatis. Pancreas: No pancreatic mass. No pancreatic ductal dilatation. No pancreatic or peripancreatic fluid collections or inflammatory changes. Spleen:  Unremarkable. Adrenals/Urinary Tract: Bilateral kidneys and bilateral adrenal glands are normal in appearance. No hydroureteronephrosis in the visualized portions of the abdomen. Stomach/Bowel: Visualized portions are unremarkable. Vascular/Lymphatic: No aneurysm identified in the visualized abdominal vasculature. No lymphadenopathy noted in the abdomen. Other: No significant volume of ascites noted in the visualized  portions of the peritoneal cavity. Musculoskeletal: No  aggressive appearing osseous lesions are noted in the visualized portions of the skeleton. IMPRESSION: 1. No acute findings are noted in the abdomen to account for the patient's symptoms. Specifically, no gallstones or findings to suggest acute cholecystitis. No choledocholithiasis or findings of biliary tract obstruction. 2. Mild hepatic steatosis. Electronically Signed   By: Trudie Reed M.D.   On: 02/08/2024 08:52   CT ABDOMEN PELVIS W CONTRAST Result Date: 02/07/2024 CLINICAL DATA:  Right upper quadrant abdominal pain, nausea, vomiting, epigastric pain EXAM: CT ABDOMEN AND PELVIS WITH CONTRAST TECHNIQUE: Multidetector CT imaging of the abdomen and pelvis was performed using the standard protocol following bolus administration of intravenous contrast. RADIATION DOSE REDUCTION: This exam was performed according to the departmental dose-optimization program which includes automated exposure control, adjustment of the mA and/or kV according to patient size and/or use of iterative reconstruction technique. CONTRAST:  OMNIPAQUE IOHEXOL 300 MG/ML  SOLN COMPARISON:  None Available. FINDINGS: Lower chest: No acute abnormality. Hepatobiliary: No focal liver abnormality is seen. No gallstones, gallbladder wall thickening, or biliary dilatation. Pancreas: Unremarkable Spleen: Unremarkable Adrenals/Urinary Tract: Adrenal glands are unremarkable. Kidneys are normal, without renal calculi, focal lesion, or hydronephrosis. Bladder is unremarkable. Stomach/Bowel: Stomach is within normal limits. Appendix absent. No evidence of bowel wall thickening, distention, or inflammatory changes. Vascular/Lymphatic: There is shotty mesenteric at which is nonspecific, but may be infectious or inflammatory in nature as can be seen with gastroenteritis or mesenteric adenitis. No frankly pathologic adenopathy within the abdomen and pelvis. The abdominal vasculature is unremarkable. Reproductive: Uterus and bilateral adnexa are  unremarkable. Other: No abdominal wall hernia or abnormality. No abdominopelvic ascites. Musculoskeletal: No acute or significant osseous findings. IMPRESSION: 1. Shotty mesenteric at which is nonspecific, but may be infectious or inflammatory in nature as can be seen with gastroenteritis or mesenteric adenitis. 2. Status post appendectomy Electronically Signed   By: Helyn Numbers M.D.   On: 02/07/2024 22:57    Microbiology: Results for orders placed or performed during the hospital encounter of 02/07/24  Culture, blood (Routine X 2) w Reflex to ID Panel     Status: None (Preliminary result)   Collection Time: 02/08/24  6:16 AM   Specimen: Blood  Result Value Ref Range Status   Specimen Description BLOOD BLOOD LEFT HAND  Final   Special Requests   Final    BOTTLES DRAWN AEROBIC AND ANAEROBIC Blood Culture adequate volume Performed at [facility], [address], [place], [place] [address]    Culture PENDING  Incomplete   Report Status PENDING  Incomplete  Culture, blood (Routine X 2) w Reflex to ID Panel     Status: None (Preliminary result)   Collection Time: 02/08/24  6:16 AM   Specimen: Blood  Result Value Ref Range Status   Specimen Description BLOOD BLOOD RIGHT HAND  Final   Special Requests   Final    BOTTLES DRAWN AEROBIC AND ANAEROBIC Blood Culture adequate volume Performed at [facility], [address] [place], [place] [address]    Culture PENDING  Incomplete   Report Status PENDING  Incomplete    Labs: CBC: Recent Labs  Lab 02/07/24 2105 02/08/24 0616  WBC 9.2 7.4  HGB 13.4 12.0  HCT 43.2 38.7  MCV 82.9 83.0  PLT 302 281   Basic Metabolic Panel: Recent Labs  Lab 02/07/24 2105 02/08/24 0616 02/08/24 1209  NA 139  --  137  K 3.6  --  3.6  CL 105  --  103  CO2 22  --  22  GLUCOSE 98  --  90  BUN 14  --  11  CREATININE 0.69  --  0.80  CALCIUM 9.6  --  9.2  MG  --  2.2  --   PHOS  --  2.9  --    Liver Function Tests: Recent Labs  Lab 02/07/24 2105  02/08/24 1209  AST 695* 325*  ALT 449* 418*  ALKPHOS 105 113  BILITOT 3.3* 1.0  PROT 8.2* 7.6  ALBUMIN 4.5 4.2   CBG: No results for input(s): "GLUCAP" in the last 168 hours.  Discharge time spent: greater than 30 minutes.  Signed: MDALA-GAUSI, Gwenette Greet, MD [facility] 02/08/2024

## 2024-02-08 NOTE — Discharge Instructions (Signed)

## 2024-02-08 NOTE — Consult Note (Signed)
 Went to ED to see patient for elevated liver enzymes and possible cholelithiasis  .Patient was discharged before I was able to see the patient .

## 2024-02-08 NOTE — Progress Notes (Signed)
   02/08/24 1116  TOC Brief Assessment  Insurance and Status Reviewed  Patient has primary care physician No  Home environment has been reviewed from home  Prior level of function: independent  Prior/Current Home Services No current home services  Social Drivers of Health Review SDOH reviewed no interventions necessary  Readmission risk has been reviewed Yes  Transition of care needs no transition of care needs at this time     PCP list added to pt's AVS.  Transition of Care Department (TOC) has reviewed patient and no TOC needs have been identified at this time. We will continue to monitor patient advancement through interdisciplinary progression rounds. If new patient transition needs arise, please place a TOC consult.

## 2024-02-08 NOTE — ED Notes (Signed)
 Pt AVS reviewed and V/S assessed, as noted in departure condition. Pt discharged. Now waiting on direction regarding patient disposition change in chart.

## 2024-02-09 LAB — MITOCHONDRIAL ANTIBODIES: Mitochondrial M2 Ab, IgG: <20 U (ref 0.0–20.0)

## 2024-02-09 LAB — IGG: IgG (Immunoglobin G), Serum: 1029 mg/dL (ref 586–1602)

## 2024-02-09 LAB — ANTI-SMOOTH MUSCLE ANTIBODY, IGG: F-Actin IgG: 19 U (ref 0–19)

## 2024-02-10 LAB — ANA: Anti Nuclear Antibody (ANA): POSITIVE — AB

## 2024-02-13 LAB — CULTURE, BLOOD (ROUTINE X 2)
Special Requests: ADEQUATE
Special Requests: ADEQUATE

## 2024-02-18 ENCOUNTER — Ambulatory Visit: Payer: MEDICAID | Admitting: Gastroenterology

## 2024-02-18 ENCOUNTER — Encounter: Payer: Self-pay | Admitting: Gastroenterology
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
## 2024-02-25 ENCOUNTER — Ambulatory Visit: Payer: No Typology Code available for payment source | Admitting: Surgery

## 2024-03-12 ENCOUNTER — Encounter: Payer: Self-pay | Admitting: Physician Assistant

## 2024-03-12 ENCOUNTER — Ambulatory Visit: Payer: MEDICAID | Admitting: Physician Assistant

## 2024-03-12 VITALS — BP 122/80 | Ht 69.0 in | Wt 205.2 lb

## 2024-03-12 DIAGNOSIS — L400 Psoriasis vulgaris: Secondary | ICD-10-CM | POA: Diagnosis not present
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
## 2024-03-12 NOTE — Assessment & Plan Note (Signed)
 Patient stable today.  Physical exam significant for silver plaque-like rash on majority of anterior portion of lower legs.  Some areas of plaque on bilateral forearms.  We discussed starting topical treatment versus referral to dermatology.  Due to the extensive nature of the disease we will just refer to dermatology for further evaluation and management with Biologics and phototherapy.  Patient advised to return to clinic for concerns of infection or fever.  Will follow-up in approximately 2 to 3 months for annual physical.

## 2024-04-22 ENCOUNTER — Ambulatory Visit: Admitting: Adult Health

## 2024-05-20 ENCOUNTER — Other Ambulatory Visit: Payer: Self-pay

## 2024-05-20 ENCOUNTER — Emergency Department (HOSPITAL_COMMUNITY)
Admission: EM | Admit: 2024-05-20 | Discharge: 2024-05-20 | Disposition: A | Discharge status: Home / Self Care | Attending: Emergency Medicine | Admitting: Emergency Medicine

## 2024-05-20 ENCOUNTER — Encounter (HOSPITAL_COMMUNITY): Payer: Self-pay | Admitting: Emergency Medicine

## 2024-05-20 DIAGNOSIS — K0889 Other specified disorders of teeth and supporting structures: Secondary | ICD-10-CM | POA: Insufficient documentation

## 2024-05-20 MED ORDER — OXYCODONE-ACETAMINOPHEN 5-325 MG PO TABS: ORAL_TABLET | ORAL | 0 refills | Status: DC

## 2024-05-20 MED ORDER — CLINDAMYCIN HCL 300 MG PO CAPS: 300.0000 mg | ORAL_CAPSULE | Freq: Four times a day (QID) | ORAL | 0 refills | Status: DC

## 2024-05-20 MED ORDER — OXYCODONE-ACETAMINOPHEN 5-325 MG PO TABS
1.0000 | ORAL_TABLET | Freq: Once | ORAL | Status: AC
  Administered 2024-05-20: 1 via ORAL
  Filled 2024-05-20: qty 1

## 2024-05-20 NOTE — ED Provider Notes (Signed)
   EMERGENCY DEPARTMENT AT [facility] Provider Note   CSN: [id number] Arrival date & time: 05/20/24  1853     History {Add pertinent medical, surgical, social history, OB history to HPI:1} Chief Complaint  Patient presents with   Dental Pain    Rebecca Marsh is a 23 y.o. female.  Patient complains of painful fever for the last few days   Dental Pain      Home Medications Prior to Admission medications   Medication Sig Start Date End Date Taking? Authorizing Provider  clindamycin (CLEOCIN) 300 MG capsule Take 1 capsule (300 mg total) by mouth 4 (four) times daily. X 7 days 05/20/24  Yes Cheyenne Cotta, MD  oxyCODONE -acetaminophen  (PERCOCET/ROXICET) 5-325 MG tablet Take 1 every 6-8 hours for pain not relieved by Tylenol  or Motrin  05/20/24  Yes Cheyenne Cotta, MD      Allergies    Penicillins and Almond oil    Review of Systems   Review of Systems  Physical Exam Updated Vital Signs BP 124/82 (BP Location: Right Arm)   Pulse (!) 53   Temp 97.8 F (36.6 C) (Oral)   Resp 18   Wt 93.4 kg   LMP 05/14/2024   SpO2 100%   BMI 30.42 kg/m  Physical Exam  ED Results / Procedures / Treatments   Labs (all labs ordered are listed, but only abnormal results are displayed) Labs Reviewed - No data to display  EKG None  Radiology No results found.  Procedures Procedures  {Document cardiac monitor, telemetry assessment procedure when appropriate:1}  Medications Ordered in ED Medications - No data to display  ED Course/ Medical Decision Making/ A&P   {   Click here for ABCD2, HEART and other calculatorsREFRESH Note before signing :1}                              Medical Decision Making Risk Prescription drug management.   Patient with probable dental caries.  She was started on Cleocin and given pain medicine will see dentist  {Document critical care time when appropriate:1} {Document review of labs and clinical decision tools ie heart  score, Chads2Vasc2 etc:1}  {Document your independent review of radiology images, and any outside records:1} {Document your discussion with family members, caretakers, and with consultants:1} {Document social determinants of health affecting pt's care:1} {Document your decision making why or why not admission, treatments were needed:1} Final Clinical Impression(s) / ED Diagnoses Final diagnoses:  Pain, dental    Rx / DC Orders ED Discharge Orders          Ordered    clindamycin (CLEOCIN) 300 MG capsule  4 times daily        05/20/24 1923    oxyCODONE -acetaminophen  (PERCOCET/ROXICET) 5-325 MG tablet        05/20/24 1923

## 2024-05-20 NOTE — ED Triage Notes (Signed)
 Pt in with R sided dental pain that has worsened despite meds today. Last ibuprofen  1pm

## 2024-05-20 NOTE — Discharge Instructions (Signed)
Follow up with a dentist next week. °

## 2024-05-21 ENCOUNTER — Ambulatory Visit: Admitting: Physician Assistant

## 2024-10-29 ENCOUNTER — Encounter: Payer: Self-pay | Admitting: Adult Health

## 2024-10-29 ENCOUNTER — Ambulatory Visit (INDEPENDENT_AMBULATORY_CARE_PROVIDER_SITE_OTHER): Payer: MEDICAID | Admitting: Adult Health

## 2024-10-29 VITALS — BP 116/79 | HR 71 | Ht 69.0 in | Wt 212.0 lb

## 2024-10-29 DIAGNOSIS — N926 Irregular menstruation, unspecified: Secondary | ICD-10-CM

## 2024-10-29 DIAGNOSIS — Z975 Presence of (intrauterine) contraceptive device: Secondary | ICD-10-CM | POA: Diagnosis not present

## 2024-10-29 DIAGNOSIS — Z3202 Encounter for pregnancy test, result negative: Secondary | ICD-10-CM

## 2024-10-29 LAB — POCT URINE PREGNANCY: Preg Test, Ur: NEGATIVE

## 2024-10-29 MED ORDER — MEGESTROL ACETATE 40 MG PO TABS: ORAL_TABLET | ORAL | 1 refills | Status: AC

## 2024-10-29 NOTE — Progress Notes (Signed)
  Subjective:     Patient ID: Rebecca Marsh, female   DOB: 2001/09/11, 23 y.o.   MRN: [id number]  HPI Rebecca Marsh is  23 year old white female,with SO, [id number] in complaining of bleeding with nexplanon , may have 2 periods a month.  Last pap was negative 04/15/23  PCP is C Grooms PA. Review of Systems Bleeding with nexaplanon, may have 2 periods a month, some lasts almost 2 weeks Denies any fatigue, dizziness or SHOB Reviewed past medical,surgical, social and family history. Reviewed medications and allergies.     Objective:   Physical Exam BP 116/79 (BP Location: Right Arm, Patient Position: Sitting, Cuff Size: Large)   Pulse 71   Ht 5' 9 (1.753 m)   Wt 212 lb (96.2 kg)   LMP 10/22/2024 (Exact Date)   Breastfeeding No   BMI 31.31 kg/m  UPT negative Skin warm and dry.  Lungs: clear to ausculation bilaterally. Cardiovascular: regular rate and rhythm.  Fall risk is low  Upstream - 10/29/24 1337       Pregnancy Intention Screening   Does the patient want to become pregnant in the next year? No    Does the patient's partner want to become pregnant in the next year? No    Would the patient like to discuss contraceptive options today? No      Contraception Wrap Up   Current Method Hormonal Implant    End Method Hormonal Implant    Contraception Counseling Provided Yes             Assessment:     1. Negative pregnancy test  - POCT urine pregnancy  2. Irregular bleeding(Primary) Bleeding with nexplanon , may have 2 periods a month and can last up to 2 weeks Will rx megace to stop bleeding Meds ordered this encounter  Medications   megestrol (MEGACE) 40 MG tablet    Sig: Take 3 tablets x 5 days then 2 tabs x 5 days then 1 tablet daily till stops    Dispense:  45 tablet    Refill:  1    Supervising Provider:   JAYNE MINDER H [[id number]]     3. Nexplanon  in place Placed 10/05/23      Plan:     Follow up prn
